# Patient Record
Sex: Female | Born: 1980 | Race: White | Hispanic: No | Marital: Married | Smoking: Never smoker
Health system: Southern US, Community
[De-identification: ages and names within clinical notes are randomized; demographics above are authoritative.]

## PROBLEM LIST (undated history)

## (undated) DIAGNOSIS — J45909 Unspecified asthma, uncomplicated: Secondary | ICD-10-CM

## (undated) DIAGNOSIS — K648 Other hemorrhoids: Secondary | ICD-10-CM

## (undated) DIAGNOSIS — K5190 Ulcerative colitis, unspecified, without complications: Secondary | ICD-10-CM

## (undated) DIAGNOSIS — T7840XA Allergy, unspecified, initial encounter: Secondary | ICD-10-CM

## (undated) DIAGNOSIS — R0789 Other chest pain: Secondary | ICD-10-CM

## (undated) DIAGNOSIS — Z9289 Personal history of other medical treatment: Secondary | ICD-10-CM

## (undated) HISTORY — DX: Personal history of other medical treatment: Z92.89

## (undated) HISTORY — PX: OTHER SURGICAL HISTORY: SHX169

## (undated) HISTORY — DX: Other chest pain: R07.89

## (undated) HISTORY — DX: Other hemorrhoids: K64.8

## (undated) HISTORY — DX: Allergy, unspecified, initial encounter: T78.40XA

## (undated) HISTORY — DX: Unspecified asthma, uncomplicated: J45.909

## (undated) HISTORY — PX: WISDOM TOOTH EXTRACTION: SHX21

## (undated) HISTORY — DX: Ulcerative colitis, unspecified, without complications: K51.90

---

## 2002-02-01 ENCOUNTER — Encounter: Payer: Self-pay | Admitting: Gastroenterology

## 2006-09-21 ENCOUNTER — Ambulatory Visit: Payer: Self-pay | Admitting: Internal Medicine

## 2006-09-21 DIAGNOSIS — J45909 Unspecified asthma, uncomplicated: Secondary | ICD-10-CM | POA: Insufficient documentation

## 2006-09-21 DIAGNOSIS — J309 Allergic rhinitis, unspecified: Secondary | ICD-10-CM | POA: Insufficient documentation

## 2006-09-21 LAB — CONVERTED CEMR LAB
Blood in Urine, dipstick: NEGATIVE
Ketones, urine, test strip: NEGATIVE
Nitrite: NEGATIVE
Protein, U semiquant: NEGATIVE
Specific Gravity, Urine: <1.005
WBC Urine, dipstick: NEGATIVE
pH: 6.5

## 2006-11-04 LAB — CONVERTED CEMR LAB
Albumin: 3.7 g/dL (ref 3.5–5.2)
Alkaline Phosphatase: 35 units/L — ABNORMAL LOW (ref 39–117)
BUN: 12 mg/dL (ref 6–23)
Basophils Absolute: 0.1 10*3/uL (ref 0.0–0.1)
Calcium: 8.8 mg/dL (ref 8.4–10.5)
Cholesterol: 161 mg/dL (ref 0–200)
GFR calc Af Amer: 112 mL/min
GFR calc non Af Amer: 93 mL/min
HDL: 45.3 mg/dL (ref >39.0)
Hemoglobin: 14 g/dL (ref 12.0–15.0)
LDL Cholesterol: 97 mg/dL (ref 0–99)
Lymphocytes Relative: 18.8 % (ref 12.0–46.0)
MCHC: 35.5 g/dL (ref 30.0–36.0)
Monocytes Absolute: 0.5 10*3/uL (ref 0.2–0.7)
Monocytes Relative: 5.6 % (ref 3.0–11.0)
Neutro Abs: 7.1 10*3/uL (ref 1.4–7.7)
Platelets: 257 10*3/uL (ref 150–400)
Potassium: 4 meq/L (ref 3.5–5.1)
Triglycerides: 96 mg/dL (ref 0–149)

## 2007-06-28 ENCOUNTER — Ambulatory Visit: Payer: Self-pay | Admitting: Internal Medicine

## 2008-02-11 ENCOUNTER — Telehealth: Payer: Self-pay | Admitting: Internal Medicine

## 2008-06-02 ENCOUNTER — Ambulatory Visit: Payer: Self-pay | Admitting: Internal Medicine

## 2008-06-02 DIAGNOSIS — K625 Hemorrhage of anus and rectum: Secondary | ICD-10-CM | POA: Insufficient documentation

## 2008-06-02 LAB — CONVERTED CEMR LAB: Hemoglobin: 14.7 g/dL

## 2008-06-07 ENCOUNTER — Ambulatory Visit: Payer: Self-pay | Admitting: Gastroenterology

## 2008-06-08 LAB — CONVERTED CEMR LAB
Basophils Relative: 0.7 % (ref 0.0–3.0)
CO2: 30 meq/L (ref 19–32)
Eosinophils Relative: 3.7 % (ref 0.0–5.0)
GFR calc non Af Amer: 91.49 mL/min (ref >60)
Glucose, Bld: 90 mg/dL (ref 70–99)
HCT: 43.2 % (ref 36.0–46.0)
Hemoglobin: 14.9 g/dL (ref 12.0–15.0)
Lymphs Abs: 2.3 10*3/uL (ref 0.7–4.0)
Monocytes Relative: 7.2 % (ref 3.0–12.0)
Neutro Abs: 2.7 10*3/uL (ref 1.4–7.7)
Platelets: 240 10*3/uL (ref 150.0–400.0)
RBC: 4.58 M/uL (ref 3.87–5.11)
Sed Rate: 5 mm/hr (ref 0–22)
Sodium: 141 meq/L (ref 135–145)
Total Bilirubin: 1 mg/dL (ref 0.3–1.2)
Total Protein: 7.3 g/dL (ref 6.0–8.3)
WBC: 5.6 10*3/uL (ref 4.5–10.5)

## 2008-06-09 ENCOUNTER — Telehealth: Payer: Self-pay | Admitting: *Deleted

## 2008-06-09 ENCOUNTER — Encounter: Payer: Self-pay | Admitting: Internal Medicine

## 2008-06-09 ENCOUNTER — Telehealth: Payer: Self-pay | Admitting: Gastroenterology

## 2008-06-14 ENCOUNTER — Encounter: Payer: Self-pay | Admitting: Internal Medicine

## 2008-06-20 ENCOUNTER — Telehealth (INDEPENDENT_AMBULATORY_CARE_PROVIDER_SITE_OTHER): Payer: Self-pay | Admitting: *Deleted

## 2008-06-28 ENCOUNTER — Encounter: Payer: Self-pay | Admitting: Gastroenterology

## 2008-06-28 ENCOUNTER — Ambulatory Visit: Payer: Self-pay | Admitting: Gastroenterology

## 2008-06-29 ENCOUNTER — Encounter (INDEPENDENT_AMBULATORY_CARE_PROVIDER_SITE_OTHER): Payer: Self-pay | Admitting: *Deleted

## 2008-06-30 ENCOUNTER — Encounter: Payer: Self-pay | Admitting: Gastroenterology

## 2008-07-31 ENCOUNTER — Ambulatory Visit: Payer: Self-pay | Admitting: Gastroenterology

## 2009-04-18 ENCOUNTER — Inpatient Hospital Stay (HOSPITAL_COMMUNITY): Admission: AD | Admit: 2009-04-18 | Discharge: 2009-04-21 | Payer: Self-pay | Admitting: Obstetrics and Gynecology

## 2009-06-06 ENCOUNTER — Telehealth (INDEPENDENT_AMBULATORY_CARE_PROVIDER_SITE_OTHER): Payer: Self-pay | Admitting: *Deleted

## 2009-06-13 ENCOUNTER — Telehealth: Payer: Self-pay | Admitting: *Deleted

## 2009-07-04 ENCOUNTER — Encounter (INDEPENDENT_AMBULATORY_CARE_PROVIDER_SITE_OTHER): Payer: Self-pay | Admitting: *Deleted

## 2009-10-26 ENCOUNTER — Ambulatory Visit: Payer: Self-pay | Admitting: Gastroenterology

## 2010-01-09 ENCOUNTER — Ambulatory Visit: Payer: Self-pay | Admitting: Internal Medicine

## 2010-01-09 DIAGNOSIS — J019 Acute sinusitis, unspecified: Secondary | ICD-10-CM | POA: Insufficient documentation

## 2010-02-24 NOTE — L&D Delivery Note (Signed)
Delivery Note At 11:03 PM a viable female was delivered via Vaginal, Spontaneous Delivery (Presentation: ; Occiput Anterior).  APGAR: 8, 9; weight 7 lb 4.6 oz (3305 g).   Placenta status: Intact, Spontaneous.  Cord: 3 vessels with the following complications: None.  Cord pH: not done  Anesthesia: Epidural  Episiotomy: None Lacerations: 2nd degree;Perineal Suture Repair: 3.0 chromic Est. Blood Loss (mL): 300  Mom to postpartum.  Baby to nursery-stable.  Kassady Laboy L 12/17/2010, 11:18 PM

## 2010-03-26 NOTE — Letter (Signed)
Summary: Office Visit Letter  [facility]  [address] [place], [place] [address]   Phone: [phone]  Fax: [phone]      Jul 04[birth year] 2009 MRN: [id number]   [facility] [place], [place]  [address]   Dear Ms. Riggins,   According to our records, it is time for you to schedule a follow-up office visit with [place] in the month of June 2011.   At your convenience, please call [phone] (option #[phone])to schedule an office visit. If you have any questions, concerns, or feel that this letter is in error, we would appreciate your call.   Sincerely,  Milus Banister, M.D.  [facility] [phone]

## 2010-03-26 NOTE — Assessment & Plan Note (Signed)
Summary: cold symptoms/sinus pressure/cjr   Vital Signs:  Patient profile:   30 year old female Menstrual status:  irregular LMP:     12/07/2009 Weight:      122 pounds Temp:     98.7 degrees F oral Pulse rate:   72 / minute BP sitting:   120 / 80  (left arm) Cuff size:   regular  Vitals Entered By: Sherron Monday, CMA (AAMA) (January 09, 2010 2:35 PM) CC: Sinus pressure, ears hurt, nose hurts and sore throat x 10 days. No fever LMP (date): 12/07/2009 LMP - Character: normal Menarche (age onset years): 13   Menses interval (days): 30-31 Menstrual flow (days): 7 Menstrual Status irregular Enter LMP: 12/07/2009 Last PAP Result Normal   History of Present Illness: Stacy Reeves   comes in today with 10 days of head cold now getting worse with  sinus pressure and achy and  ear ache. Decongestant no help.   and motrin. No teeth  pain but very tight in face.    75 month old at home  with Om and Samuel [place].  No sig coughing  no wheezing  .  nursing her infant.  .     Preventive Screening-Counseling & Management  Alcohol-Tobacco     Alcohol drinks/day: <1     Alcohol type: wine     Smoking Status: never  Caffeine-Diet-Exercise     Does Patient Exercise: yes     Type of exercise: walking or running     Times/week: 4  Current Medications (verified): 1)  Allegra 180 Mg  Tabs (Fexofenadine Hcl) .Marland Kitchen.. 1 By Mouth Once Daily 2)  Multivitamins   Tabs (Multiple Vitamin) 3)  Flonase 50 Mcg/act  Susp (Fluticasone Propionate) .... 2 Sprays Each Nares Once Daily  For Allrergic Rhinitis 4)  Calcarb 600 1500 Mg Tabs (Calcium Carbonate) .... Take 1 Tablet By Mouth Once A Day 5)  Proair Hfa 108 (90 Base) Mcg/act Aers (Albuterol Sulfate) .... Use As Directed 6)  Canasa 1000 Mg  Supp (Mesalamine) .... Take One, Nightly As Needed  Allergies (verified): 1)  ! Sulfa  Past History:  Past medical, surgical, family and social histories (including risk factors) reviewed, and no changes noted  (except as noted below).  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 g1 p1   2011 sigmoidoscopy in 2005, in [place], inflammation seen spring allergic rhinits.   Past Surgical History: Reviewed history from 06/07/2008 and no changes required. mole  removals no cancer   Past History:  Care Management: Gynecology: Dr. Feliciana Rossetti in [place]-in the past; Pt now see's Dr. Julien Girt  Family History: Reviewed history from 06/02/2008 and no changes required. Family History High cholesterol FA Family History Hypertension negative for diabetes ? if polyps  in parents no cancer or IBD  or colitis  Social History: Reviewed history from 06/07/2008 and no changes required. Occupation:an accountant for [employer] Married Never Smoked rarely drinks alcohol, rarely drinks caffeinated beverages 77 month old nursing   child  at homehas cold and Om    Review of Systems  The patient denies anorexia, fever, weight loss, weight gain, vision loss, decreased hearing, hoarseness, chest pain, syncope, dyspnea on exertion, peripheral edema, prolonged cough, abdominal pain, melena, hematochezia, severe indigestion/heartburn, transient blindness, difficulty walking, unusual weight change, enlarged lymph nodes, and angioedema.    Physical Exam  General:  congested in nad    Head:  Normocephalic and atraumatic without obvious  abnormalities. No apparent alopecia or balding. Eyes:  PERRL, EOMs full, conjunctiva clear  Ears:  R ear normal, L ear normal, and no external deformities.   Nose:  no external deformity and no external erythema.  3+ turbinates  very colgges no tenderness to tapping Mouth:  pharynx pink and moist.   Neck:  No deformities, masses, or tenderness noted. Lungs:  Normal respiratory effort, chest expands symmetrically. Lungs are clear to auscultation, no crackles or wheezes. Heart:  Normal rate and regular rhythm. S1 and S2  normal without gallop, murmur, click, rub or other extra sounds. Cervical Nodes:  No lymphadenopathy noted Psych:  Oriented X3 and normally interactive.     Impression & Recommendations:  Problem # 1:  SINUSITIS - ACUTE-NOS (QPR-916.9) prolonged uri worsening  unresonsive to decongestants  Her updated medication list for this problem includes:    Flonase 50 Mcg/act Susp (Fluticasone propionate) .Marland Kitchen... 2 sprays each nares once daily  for allrergic rhinitis    Amoxicillin 875 Mg Tabs (Amoxicillin) .Marland Kitchen... 1 by mouth three times a day  for sinusitis    Fluticasone Propionate 50 Mcg/act Susp (Fluticasone propionate) .Marland Kitchen... 2 sprays each nostril once daily  Problem # 2:  ALLERGIC RHINITIS (ICD-477.9) spring mostly but could contribute   restart flonase Her updated medication list for this problem includes:    Allegra 180 Mg Tabs (Fexofenadine hcl) .Marland Kitchen... 1 by mouth once daily    Flonase 50 Mcg/act Susp (Fluticasone propionate) .Marland Kitchen... 2 sprays each nares once daily  for allrergic rhinitis    Fluticasone Propionate 50 Mcg/act Susp (Fluticasone propionate) .Marland Kitchen... 2 sprays each nostril once daily  Complete Medication List: 1)  Allegra 180 Mg Tabs (Fexofenadine hcl) .Marland Kitchen.. 1 by mouth once daily 2)  Multivitamins Tabs (Multiple vitamin) 3)  Flonase 50 Mcg/act Susp (Fluticasone propionate) .... 2 sprays each nares once daily  for allrergic rhinitis 4)  Calcarb 600 1500 Mg Tabs (Calcium carbonate) .... Take 1 tablet by mouth once a day 5)  Proair Hfa 108 (90 Base) Mcg/act Aers (Albuterol sulfate) .... Use as directed 6)  Canasa 1000 Mg Supp (Mesalamine) .... Take one, nightly as needed 7)  Amoxicillin 875 Mg Tabs (Amoxicillin) .Marland Kitchen.. 1 by mouth three times a day  for sinusitis 8)  Fluticasone Propionate 50 Mcg/act Susp (Fluticasone propionate) .... 2 sprays each nostril once daily  Patient Instructions: 1)  Acute sinusitis symptoms for less than 10 days are not helped by antibiotics. Use warm moist  compresses, and over the counter decongestants( only as directed). Call if no improvement in 5-7 days, sooner if increasing pain, fever, or new symptoms.  2)  add antibiotic  and  steroid nose spray . 3)  can use afrin for 2-3 days if needed to relieve pressure in the meantime.  4)  expedt improvment in 3-5 days .  call if  needed Prescriptions: FLUTICASONE PROPIONATE 50 MCG/ACT SUSP (FLUTICASONE PROPIONATE) 2 sprays each nostril once daily  #1 x prn   Entered and Authorized by:   Burnis Medin MD   Signed by:   Burnis Medin MD on 01/09/2010   Method used:   [facility] to        [facility] [address] [phone]* (retail)       [address]       [place], [place]  [address]       Ph: [phone] or [phone]       Fax: [phone]  RxID:   [id number] AMOXICILLIN 875 MG TABS (AMOXICILLIN) 1 by mouth three times a day  for sinusitis  #30 x 0   Entered and Authorized by:   Burnis Medin MD   Signed by:   Burnis Medin MD on 01/09/2010   Method used:   [facility]        [facility] [address] [phone]* (retail)       [address]       [place], [place]  [address]       Ph: [phone] or [phone]       Fax: [phone]   RxID:   [id number]    Orders Added: 1)  Est. Patient Level IV [12244]

## 2010-03-26 NOTE — Progress Notes (Signed)
Summary: REQ FOR NEW Rx  [facility])  Phone Note Call from Patient   Caller: Patient Reason for Call: Refill Medication Summary of Call: Pt called to see if Dr Regis Bill will approve a new script for her that she was originally prescribed by another physician...Marland Kitchen??  Pt adv that she has had the med:  Proair in the past and would like to have a Rx for same sent to [facility] [place].  Pt can be reached at [phone] with any questions or concerns.  Initial call taken by: Duanne Moron,  June 13, 2009 8:40 AM  Follow-up for Phone Call        i havent seen her in a year   . Needs OV  however  can get more info and may be able to give her an rx in the meantime .  ? dx what are her signs etc etc.  Follow-up by: Burnis Medin MD,  June 13, 2009 1:42 PM  Additional Follow-up for Phone Call Additional follow up Details #1::        LMTOCB Additional Follow-up by: Sherron Monday, [place] Deborra Medina),  June 13, 2009 3:34 PM    Additional Follow-up for Phone Call Additional follow up Details #2::    Spoke to pt- she is having some wheezing during the day worse in am. She takes allegra during but doesn't help with wheezing. Pt aware that rx has been called in and that she needs a rov before next refill. Follow-up by: Sherron Monday, CMA (AAMA),  June 13, 2009 5:09 PM  New/Updated Medications: PROAIR HFA 108 (90 BASE) MCG/ACT AERS (ALBUTEROL SULFATE) use as directed Prescriptions: PROAIR HFA 108 (90 BASE) MCG/ACT AERS (ALBUTEROL SULFATE) use as directed  #1 x 0   Entered by:   Sherron Monday, CMA (AAMA)   Authorized by:   Burnis Medin MD   Signed by:   Sherron Monday, CMA (AAMA) on 06/13/2009   Method used:   [facility]        [facility] [address] [phone]* (retail)       [address]       [place], [place]  [address]       Ph: [phone] or [phone]       Fax: [phone]   RxID:   [id number]

## 2010-03-26 NOTE — Progress Notes (Signed)
Summary: Records request from [place] for records received from [facility]. Request forwarded to [employer]. Dena Chavis  June 06, 2009 4:50 PM

## 2010-03-26 NOTE — Assessment & Plan Note (Signed)
  Review of gastrointestinal problems: 1. Limited proctitis, diagnosed originally 2004 By flexible sigmoidoscopy elsewhere. Responded to steroid suppositories. Colonoscopy May 2010 by Dr. Ardis Hughs showed chronic inflammation to 6-7 cm from anus, biopsies confirmed chronic inflammation.  Terminal ileum was normal.  She responded well to nightly Canasa suppositories.  August, 2011: doing very well after the birth of her first daughter in February.   History of Present Illness Visit Type: Follow-up Visit Primary GI MD: Owens Loffler MD Primary Provider: Shanon Ace, MD Requesting Provider: na Chief Complaint: Yearly f/u  History of Present Illness:     very pleasant 30 year old woman whom I last saw a bit over a year ago. Since then she has had an uneventful pregnancy and vaginal delivery of her first daughter, born this past February.  She has done very well from a proctitis standpoint, she has not needed any suppositories or other treatments for about a year.           Current Medications (verified): 1)  Allegra 180 Mg  Tabs (Fexofenadine Hcl) .Marland Kitchen.. 1 By Mouth Once Daily 2)  Multivitamins   Tabs (Multiple Vitamin) 3)  Flonase 50 Mcg/act  Susp (Fluticasone Propionate) .... 2 Sprays Each Nares Once Daily  For Allrergic Rhinitis 4)  Calcarb 600 1500 Mg Tabs (Calcium Carbonate) .... Take 1 Tablet By Mouth Once A Day 5)  Proair Hfa 108 (90 Base) Mcg/act Aers (Albuterol Sulfate) .... Use As Directed  Allergies (verified): 1)  ! Sulfa  Vital Signs:  Patient profile:   30 year old female Menstrual status:  regular Height:      67 inches Weight:      124 pounds BMI:     19.49 BSA:     1.65 Pulse rate:   60 / minute Pulse rhythm:   regular BP sitting:   120 / 64  (left arm) Cuff size:   regular  Vitals Entered By: Hope Pigeon CMA (October 26, 2009 3:47 PM)  Physical Exam  Additional Exam:  Constitutional: generally well appearing Psychiatric: alert and oriented times 3 Abdomen:  soft, non-tender, non-distended, normal bowel sounds    Impression & Recommendations:  Problem # 1:  proctitis she had very mild, limited proctitis that was very easy to control on Canasa suppositories only. I have given her a prescription for the suppositories that she can fill if she needs. She will call to let me know if that is the case. I think she can followup on an as-needed basis otherwise.  Patient Instructions: 1)  Let Dr. Ardis Hughs know if new troubles arise. 2)  Script for canasa given. 3)  The medication list was reviewed and reconciled.  All changed / newly prescribed medications were explained.  A complete medication list was provided to the patient / caregiver. Prescriptions: CANASA 1000 MG  SUPP (MESALAMINE) take one, nightly as needed  #30 x 4   Entered and Authorized by:   Milus Banister MD   Signed by:   Milus Banister MD on 10/26/2009   Method used:   Print then Give to Patient   RxID:   [id number]

## 2010-05-16 LAB — CBC
Hemoglobin: 10.9 g/dL — ABNORMAL LOW (ref 12.0–15.0)
Hemoglobin: 9.4 g/dL — ABNORMAL LOW (ref 12.0–15.0)
MCHC: 33 g/dL (ref 30.0–36.0)
MCV: 90.6 fL (ref 78.0–100.0)
Platelets: 186 10*3/uL (ref 150–400)
RDW: 13.6 % (ref 11.5–15.5)
RDW: 13.6 % (ref 11.5–15.5)

## 2010-05-17 LAB — ANTIBODY SCREEN: Antibody Screen: NEGATIVE

## 2010-05-17 LAB — ABO/RH: RH Type: NEGATIVE

## 2010-05-17 LAB — HEPATITIS B SURFACE ANTIGEN: Hepatitis B Surface Ag: NEGATIVE

## 2010-05-17 LAB — RPR: RPR: NONREACTIVE

## 2010-11-16 LAB — STREP B DNA PROBE: GBS: NEGATIVE

## 2010-12-17 ENCOUNTER — Inpatient Hospital Stay (HOSPITAL_COMMUNITY): Payer: BC Managed Care – PPO | Admitting: Anesthesiology

## 2010-12-17 ENCOUNTER — Encounter (HOSPITAL_COMMUNITY): Payer: Self-pay | Admitting: *Deleted

## 2010-12-17 ENCOUNTER — Inpatient Hospital Stay (HOSPITAL_COMMUNITY)
Admission: AD | Admit: 2010-12-17 | Discharge: 2010-12-19 | DRG: 373 | Disposition: A | Discharge status: Home / Self Care | Payer: BC Managed Care – PPO | Source: Ambulatory Visit | Attending: Obstetrics and Gynecology | Admitting: Obstetrics and Gynecology

## 2010-12-17 ENCOUNTER — Encounter (HOSPITAL_COMMUNITY): Payer: Self-pay | Admitting: Anesthesiology

## 2010-12-17 DIAGNOSIS — J45909 Unspecified asthma, uncomplicated: Secondary | ICD-10-CM

## 2010-12-17 DIAGNOSIS — J019 Acute sinusitis, unspecified: Secondary | ICD-10-CM

## 2010-12-17 DIAGNOSIS — J309 Allergic rhinitis, unspecified: Secondary | ICD-10-CM

## 2010-12-17 DIAGNOSIS — K625 Hemorrhage of anus and rectum: Secondary | ICD-10-CM

## 2010-12-17 LAB — CBC
MCH: 33.1 pg (ref 26.0–34.0)
Platelets: 182 10*3/uL (ref 150–400)
RBC: 3.81 MIL/uL — ABNORMAL LOW (ref 3.87–5.11)
RDW: 12.6 % (ref 11.5–15.5)
WBC: 7.5 10*3/uL (ref 4.0–10.5)

## 2010-12-17 MED ORDER — LACTATED RINGERS IV SOLN
INTRAVENOUS | Status: DC
  Administered 2010-12-17: 22:00:00 via INTRAVENOUS

## 2010-12-17 MED ORDER — OXYCODONE-ACETAMINOPHEN 5-325 MG PO TABS: 2.0000 | ORAL_TABLET | ORAL | Status: DC | PRN

## 2010-12-17 MED ORDER — ONDANSETRON HCL 4 MG/2ML IJ SOLN: 4.0000 mg | Freq: Four times a day (QID) | INTRAMUSCULAR | Status: DC | PRN

## 2010-12-17 MED ORDER — DIPHENHYDRAMINE HCL 50 MG/ML IJ SOLN: 12.5000 mg | INTRAMUSCULAR | Status: DC | PRN

## 2010-12-17 MED ORDER — OXYTOCIN 20 UNITS IN LACTATED RINGERS INFUSION - SIMPLE
125.0000 mL/h | Freq: Once | INTRAVENOUS | Status: AC
  Administered 2010-12-17: 999 mL/h via INTRAVENOUS
  Filled 2010-12-17: qty 1000

## 2010-12-17 MED ORDER — LIDOCAINE HCL (PF) 1 % IJ SOLN
30.0000 mL | INTRAMUSCULAR | Status: DC | PRN
  Filled 2010-12-17: qty 30

## 2010-12-17 MED ORDER — CITRIC ACID-SODIUM CITRATE 334-500 MG/5ML PO SOLN: 30.0000 mL | ORAL | Status: DC | PRN

## 2010-12-17 MED ORDER — FLEET ENEMA 7-19 GM/118ML RE ENEM: 1.0000 | ENEMA | RECTAL | Status: DC | PRN

## 2010-12-17 MED ORDER — EPHEDRINE 5 MG/ML INJ
10.0000 mg | INTRAVENOUS | Status: DC | PRN
  Filled 2010-12-17: qty 4

## 2010-12-17 MED ORDER — PHENYLEPHRINE 40 MCG/ML (10ML) SYRINGE FOR IV PUSH (FOR BLOOD PRESSURE SUPPORT)
80.0000 ug | PREFILLED_SYRINGE | INTRAVENOUS | Status: DC | PRN
  Filled 2010-12-17: qty 5

## 2010-12-17 MED ORDER — LACTATED RINGERS IV SOLN: 500.0000 mL | Freq: Once | INTRAVENOUS | Status: DC

## 2010-12-17 MED ORDER — PHENYLEPHRINE 40 MCG/ML (10ML) SYRINGE FOR IV PUSH (FOR BLOOD PRESSURE SUPPORT)
80.0000 ug | PREFILLED_SYRINGE | INTRAVENOUS | Status: DC | PRN
  Filled 2010-12-17 (×2): qty 5

## 2010-12-17 MED ORDER — SODIUM BICARBONATE 8.4 % IV SOLN
INTRAVENOUS | Status: DC | PRN
  Administered 2010-12-17: 4 mL via EPIDURAL

## 2010-12-17 MED ORDER — LACTATED RINGERS IV SOLN: 500.0000 mL | INTRAVENOUS | Status: DC | PRN

## 2010-12-17 MED ORDER — FENTANYL 2.5 MCG/ML BUPIVACAINE 1/10 % EPIDURAL INFUSION (WH - ANES)
INTRAMUSCULAR | Status: DC | PRN
  Administered 2010-12-17: 14 mL/h via EPIDURAL

## 2010-12-17 MED ORDER — IBUPROFEN 600 MG PO TABS
600.0000 mg | ORAL_TABLET | Freq: Four times a day (QID) | ORAL | Status: DC | PRN
  Administered 2010-12-17: 600 mg via ORAL
  Filled 2010-12-17: qty 1

## 2010-12-17 MED ORDER — FENTANYL 2.5 MCG/ML BUPIVACAINE 1/10 % EPIDURAL INFUSION (WH - ANES)
14.0000 mL/h | INTRAMUSCULAR | Status: DC
  Filled 2010-12-17: qty 60

## 2010-12-17 MED ORDER — EPHEDRINE 5 MG/ML INJ
10.0000 mg | INTRAVENOUS | Status: DC | PRN
  Filled 2010-12-17 (×2): qty 4

## 2010-12-17 MED ORDER — ACETAMINOPHEN 325 MG PO TABS: 650.0000 mg | ORAL_TABLET | ORAL | Status: DC | PRN

## 2010-12-17 MED ORDER — OXYTOCIN BOLUS FROM INFUSION
500.0000 mL | Freq: Once | INTRAVENOUS | Status: DC
  Filled 2010-12-17: qty 500

## 2010-12-17 NOTE — Anesthesia Preprocedure Evaluation (Signed)
Anesthesia Evaluation  Patient identified by MRN, date of birth, ID band Patient awake  General Assessment Comment  Reviewed: Allergy & Precautions, H&P , Patient's Chart, lab work & pertinent test results  Airway Mallampati: II TM Distance: >3 FB Neck ROM: full    Dental  (+) Teeth Intact   Pulmonary  clear to auscultation        Cardiovascular regular Normal    Neuro/Psych    GI/Hepatic   Endo/Other    Renal/GU      Musculoskeletal   Abdominal   Peds  Hematology   Anesthesia Other Findings       Reproductive/Obstetrics (+) Pregnancy                           Anesthesia Physical Anesthesia Plan  ASA: II  Anesthesia Plan: Epidural   Post-op Pain Management:    Induction:   Airway Management Planned:   Additional Equipment:   Intra-op Plan:   Post-operative Plan:   Informed Consent: I have reviewed the patients History and Physical, chart, labs and discussed the procedure including the risks, benefits and alternatives for the proposed anesthesia with the patient or authorized representative who has indicated his/her understanding and acceptance.   Dental Advisory Given  Plan Discussed with:   Anesthesia Plan Comments: (Labs checked- platelets confirmed with RN in room. Fetal heart tracing, per RN, reported to be stable enough for sitting procedure. Discussed epidural, and patient consents to the procedure:  included risk of possible headache,backache, failed block, allergic reaction, and nerve injury. This patient was asked if she had any questions or concerns before the procedure started. )        Anesthesia Quick Evaluation

## 2010-12-17 NOTE — Progress Notes (Signed)
PT  SAYS SHE STARTED HURT BAD AT 2PM.  LAST FRI 3 CM  IN OFFICE. DENIES  HSV AND MRSA.

## 2010-12-17 NOTE — Anesthesia Procedure Notes (Signed)
Epidural Patient location during procedure: OB Start time: 12/17/2010 9:38 PM  Preanesthetic Checklist Completed: patient identified, site marked, surgical consent, pre-op evaluation, timeout performed, IV checked, risks and benefits discussed and monitors and equipment checked  Epidural Patient position: sitting Prep: site prepped and draped and DuraPrep Patient monitoring: continuous pulse ox and blood pressure Approach: midline Injection technique: LOR air  Needle:  Needle type: Tuohy  Needle gauge: 17 G Needle length: 9 cm Needle insertion depth: 5 cm cm Catheter type: closed end flexible Catheter size: 19 Gauge Catheter at skin depth: 10 cm Test dose: negative  Assessment Events: blood not aspirated, injection not painful, no injection resistance, negative IV test and no paresthesia  Additional Notes Dosing of Epidural:  1st dose, through needle ............................................Marland Kitchen epi 1:200K + Xylocaine 40 mg  2nd dose, through catheter, after waiting 3 minutes...Marland KitchenMarland Kitchenepi 1:200K + Xylocaine 40 mg  3rd dose, through catheter after waiting 3 minutes .............................Marcaine   62m   ( mg Marcaine are expressed as equivilent  cc's medication removed from the 0.1%Bupiv / fentanyl syringe from L&D pump)  ( 2% Xylo charted as a single dose in Epic Meds for ease of charting; actual dosing was fractionated as above, for saftey's sake)  As each dose occurred, patient was free of IV sx; and patient exhibited no evidence of SA injection.  Patient is more comfortable after epidural dosed. Please see RN's note for documentation of vital signs,and FHR which are stable.

## 2010-12-17 NOTE — H&P (Signed)
30 year old G2 P1001 at term presented in active labor Was 5 cm on admission PNC see [facility] History of 1 NSVD  Afebrile  Vital Signs Stable FHR is reactive Toco Uterine Contractions every 2 to 3 minutes Lung CTAB Car RRR Cervix C/C/+3  Imp IUP at term  Plan NSVD

## 2010-12-18 ENCOUNTER — Encounter (HOSPITAL_COMMUNITY): Payer: Self-pay | Admitting: *Deleted

## 2010-12-18 LAB — CBC
Hemoglobin: 11.6 g/dL — ABNORMAL LOW (ref 12.0–15.0)
MCHC: 34.4 g/dL (ref 30.0–36.0)
Platelets: 147 10*3/uL — ABNORMAL LOW (ref 150–400)

## 2010-12-18 LAB — RPR: RPR Ser Ql: NONREACTIVE

## 2010-12-18 MED ORDER — BISACODYL 10 MG RE SUPP: 10.0000 mg | Freq: Every day | RECTAL | Status: DC | PRN

## 2010-12-18 MED ORDER — MEASLES, MUMPS & RUBELLA VAC ~~LOC~~ INJ
0.5000 mL | INJECTION | Freq: Once | SUBCUTANEOUS | Status: DC
  Filled 2010-12-18: qty 0.5

## 2010-12-18 MED ORDER — FLEET ENEMA 7-19 GM/118ML RE ENEM: 1.0000 | ENEMA | RECTAL | Status: DC | PRN

## 2010-12-18 MED ORDER — SENNOSIDES-DOCUSATE SODIUM 8.6-50 MG PO TABS
2.0000 | ORAL_TABLET | Freq: Every day | ORAL | Status: DC
  Administered 2010-12-18: 2 via ORAL

## 2010-12-18 MED ORDER — MEDROXYPROGESTERONE ACETATE 150 MG/ML IM SUSP: 150.0000 mg | INTRAMUSCULAR | Status: DC | PRN

## 2010-12-18 MED ORDER — ZOLPIDEM TARTRATE 5 MG PO TABS: 5.0000 mg | ORAL_TABLET | Freq: Every evening | ORAL | Status: DC | PRN

## 2010-12-18 MED ORDER — IBUPROFEN 600 MG PO TABS
600.0000 mg | ORAL_TABLET | Freq: Four times a day (QID) | ORAL | Status: DC
  Administered 2010-12-18 – 2010-12-19 (×6): 600 mg via ORAL
  Filled 2010-12-18 (×6): qty 1

## 2010-12-18 MED ORDER — BENZOCAINE-MENTHOL 20-0.5 % EX AERO
INHALATION_SPRAY | CUTANEOUS | Status: AC
  Administered 2010-12-18: 02:00:00
  Filled 2010-12-18: qty 56

## 2010-12-18 MED ORDER — DIBUCAINE 1 % RE OINT
1.0000 "application " | TOPICAL_OINTMENT | RECTAL | Status: DC | PRN
  Administered 2010-12-18: 1 via RECTAL
  Filled 2010-12-18: qty 28

## 2010-12-18 MED ORDER — OXYCODONE-ACETAMINOPHEN 5-325 MG PO TABS
1.0000 | ORAL_TABLET | ORAL | Status: DC | PRN
  Administered 2010-12-18: 1 via ORAL
  Filled 2010-12-18: qty 1

## 2010-12-18 MED ORDER — LANOLIN HYDROUS EX OINT: TOPICAL_OINTMENT | CUTANEOUS | Status: DC | PRN

## 2010-12-18 MED ORDER — RHO D IMMUNE GLOBULIN 1500 UNIT/2ML IJ SOLN
300.0000 ug | Freq: Once | INTRAMUSCULAR | Status: AC
  Administered 2010-12-18: 300 ug via INTRAMUSCULAR
  Filled 2010-12-18: qty 2

## 2010-12-18 MED ORDER — ONDANSETRON HCL 4 MG PO TABS: 4.0000 mg | ORAL_TABLET | ORAL | Status: DC | PRN

## 2010-12-18 MED ORDER — SIMETHICONE 80 MG PO CHEW: 80.0000 mg | CHEWABLE_TABLET | ORAL | Status: DC | PRN

## 2010-12-18 MED ORDER — WITCH HAZEL-GLYCERIN EX PADS
1.0000 "application " | MEDICATED_PAD | CUTANEOUS | Status: DC | PRN
  Administered 2010-12-18: 1 via TOPICAL

## 2010-12-18 MED ORDER — TETANUS-DIPHTH-ACELL PERTUSSIS 5-2.5-18.5 LF-MCG/0.5 IM SUSP: 0.5000 mL | Freq: Once | INTRAMUSCULAR | Status: DC

## 2010-12-18 MED ORDER — PRENATAL PLUS 27-1 MG PO TABS
1.0000 | ORAL_TABLET | Freq: Every day | ORAL | Status: DC
  Administered 2010-12-18 – 2010-12-19 (×2): 1 via ORAL
  Filled 2010-12-18 (×2): qty 1

## 2010-12-18 MED ORDER — BENZOCAINE-MENTHOL 20-0.5 % EX AERO: 1.0000 "application " | INHALATION_SPRAY | CUTANEOUS | Status: DC | PRN

## 2010-12-18 MED ORDER — ONDANSETRON HCL 4 MG/2ML IJ SOLN: 4.0000 mg | INTRAMUSCULAR | Status: DC | PRN

## 2010-12-18 MED ORDER — DIPHENHYDRAMINE HCL 25 MG PO CAPS
25.0000 mg | ORAL_CAPSULE | Freq: Four times a day (QID) | ORAL | Status: DC | PRN
  Administered 2010-12-18: 25 mg via ORAL
  Filled 2010-12-18: qty 1

## 2010-12-18 NOTE — Progress Notes (Signed)
Post Partum Day 1 Subjective: no complaints, up ad lib and voiding  Objective: Blood pressure 112/68, pulse 78, temperature 98.5 F (36.9 C), temperature source Oral, resp. rate 18, height 5' 6"  (1.676 m), weight 73.539 kg (162 lb 2 oz), SpO2 100.00%, unknown if currently breastfeeding.  Physical Exam:  General: alert and cooperative Lochia: appropriate Uterine Fundus: firm Perineum intact, small labial edema DVT Evaluation: No evidence of DVT seen on physical exam.   Basename 12/18/10 0505 12/17/10 2030  HGB 11.6* 12.6  HCT 33.7* 36.2    Assessment/Plan: Plan for discharge tomorrow   LOS: 1 day   Amol Domanski G 12/18/2010, 8:08 AM

## 2010-12-19 LAB — RH IG WORKUP (INCLUDES ABO/RH)
ABO/RH(D): A NEG
Gestational Age(Wks): 40
Unit division: 0

## 2010-12-19 MED ORDER — IBUPROFEN 600 MG PO TABS: 600.0000 mg | ORAL_TABLET | Freq: Four times a day (QID) | ORAL | Status: AC

## 2010-12-19 NOTE — Addendum Note (Signed)
Addendum  created 12/19/10 1935 by Riccardo Dubin   Modules edited:Notes Section

## 2010-12-19 NOTE — Discharge Summary (Signed)
Obstetric Discharge Summary Reason for Admission: onset of labor Prenatal Procedures: none Intrapartum Procedures: spontaneous vaginal delivery Postpartum Procedures: none Complications-Operative and Postpartum: 2 degree perineal laceration Hemoglobin  Date Value Range Status  12/18/2010 11.6* 12.0-15.0 (g/dL) Final     HCT  Date Value Range Status  12/18/2010 33.7* 36.0-46.0 (%) Final    Discharge Diagnoses: Term Pregnancy-delivered  Discharge Information: Date: 12/19/2010 Activity: pelvic rest Diet: routine Medications: PNV and Ibuprofen Condition: stable Instructions: refer to practice specific booklet Discharge to: home   Newborn Data: Live born female  Birth Weight: 7 lb 4.6 oz (3305 g) APGAR: 8, 9  Home with mother.  Stacy Reeves G 12/19/2010, 8:17 AM

## 2010-12-19 NOTE — Anesthesia Postprocedure Evaluation (Signed)
  Anesthesia Post-op Note  Patient: Stacy Reeves  This patient has recovered from her labor epidural, and I am not aware of any complications or problems.

## 2010-12-19 NOTE — Progress Notes (Signed)
Post Partum Day 2 Subjective: no complaints, up ad lib, voiding and + flatus  Objective: Blood pressure 109/72, pulse 61, temperature 98.5 F (36.9 C), temperature source Oral, resp. rate 18, height 5' 6"  (1.676 m), weight 73.539 kg (162 lb 2 oz), SpO2 100.00%, unknown if currently breastfeeding.  Physical Exam:  General: alert and cooperative Lochia: appropriate Uterine Fundus: firm Incision: healing well, small hemorrhoid DVT Evaluation: No evidence of DVT seen on physical exam.   Basename 12/18/10 0505 12/17/10 2030  HGB 11.6* 12.6  HCT 33.7* 36.2    Assessment/Plan: Discharge home   LOS: 2 days   Sequita Wise G 12/19/2010, 8:12 AM

## 2011-04-21 ENCOUNTER — Encounter: Payer: Self-pay | Admitting: Internal Medicine

## 2011-04-21 ENCOUNTER — Ambulatory Visit (INDEPENDENT_AMBULATORY_CARE_PROVIDER_SITE_OTHER): Payer: BC Managed Care – PPO | Admitting: Internal Medicine

## 2011-04-21 VITALS — BP 118/70 | HR 100 | Temp 98.2°F | Wt 125.0 lb

## 2011-04-21 DIAGNOSIS — J988 Other specified respiratory disorders: Secondary | ICD-10-CM

## 2011-04-21 DIAGNOSIS — J22 Unspecified acute lower respiratory infection: Secondary | ICD-10-CM | POA: Insufficient documentation

## 2011-04-21 DIAGNOSIS — R0602 Shortness of breath: Secondary | ICD-10-CM

## 2011-04-21 NOTE — Patient Instructions (Addendum)
This acts like a viral resp infection Lungs are normal  exam and pulse ox is normal Call if getting high fever chills or inc sob  If wheezing can use your rescue inhaler.

## 2011-04-21 NOTE — Progress Notes (Signed)
  Subjective:    Patient ID: Stacy Reeves, female    DOB: 08/17/1980, 31 y.o.   MRN: [id number]  HPI Patient comes in today for SDA  For acute problem evaluation.  since last visit in 2011 she has had another child in the fall of 2012  Has 2 children at home . Has had uris at home however this weekend had .  Onset sat with fever   And achy  (  2 days ago. ) now fever may be gone but has ?  Some  Sob?  Hx of wheezing     In spring.  Not now  Nasal congestion  Nursing .  No hemoptysis  Temp yesterday.  Wants to make sure lungs are clear.  Review of Systems No fever now NVD as per hpi   Past history family history social history reviewed in the electronic medical record. Had flu shot this year    Objective:   Physical Exam WDWN in NAD  quiet respirations; nasal congested  somewhat hoarse. Non toxic . HEENT: Normocephalic ;atraumatic , Eyes;  PERRL, EOMs  Full, lids and conjunctiva clear,,Ears: no deformities, canals nl, TM landmarks normal, Nose: no deformity or discharge but congested;face non tender Mouth : OP clear without lesion or edema . Neck: Supple without adenopathy or masses or bruits Chest:  Clear to A&P without wheezes rales or rhonchi CV:  S1-S2 no gallops or murmurs peripheral perfusion is normal Skin :nl perfusion and no acute rashes       Assessment & Plan:  Acute rti prob viral uncomplicated    Sob feeling could be from UR airway  Nl exam and nl pulse ox .however if having sig fever increasing sob or wheeze  call for advice or reeval.

## 2011-09-12 ENCOUNTER — Ambulatory Visit (INDEPENDENT_AMBULATORY_CARE_PROVIDER_SITE_OTHER): Payer: BC Managed Care – PPO | Admitting: Internal Medicine

## 2011-09-12 ENCOUNTER — Encounter: Payer: Self-pay | Admitting: Internal Medicine

## 2011-09-12 VITALS — BP 108/70 | HR 67 | Temp 98.4°F | Wt 120.0 lb

## 2011-09-12 DIAGNOSIS — R509 Fever, unspecified: Secondary | ICD-10-CM

## 2011-09-12 DIAGNOSIS — B341 Enterovirus infection, unspecified: Secondary | ICD-10-CM | POA: Insufficient documentation

## 2011-09-12 DIAGNOSIS — J029 Acute pharyngitis, unspecified: Secondary | ICD-10-CM

## 2011-09-12 MED ORDER — PREDNISONE 20 MG PO TABS: ORAL_TABLET | ORAL | Status: AC

## 2011-09-12 NOTE — Progress Notes (Signed)
  Subjective:    Patient ID: Stacy Reeves, female    DOB: 01/31/1981, 31 y.o.   MRN: [id number]  HPI Patient comes in today for SDA for  new problem evaluation. Onset a few days ago of bad sore throat and fever 103 at night ;noted small blisters in throat . Last pm fever 100.7 range .  No rash and no cough uri.  9 mos and 2.30 yo have hand foot and mouth. Not in day care . No other known exposures . No hx of strep ( neg tests on children)  Going out of town.  Still nursing but weaning.  Taking gargles and obu bet still hurts to swallow. Going to beach next week.  Has ? About infectivity Past history family history social history reviewed in the electronic medical record. Review of Systems  Neg ha syncope cough Gi Gu sx  .  Rash joint changes.  Outpatient Encounter Prescriptions as of 09/12/2011  Medication Sig Dispense Refill  . albuterol (PROAIR HFA) 108 (90 BASE) MCG/ACT inhaler Inhale 2 puffs into the lungs every 6 (six) hours as needed.      . fexofenadine (ALLEGRA) 180 MG tablet Take 180 mg by mouth daily.      . fluticasone (FLONASE) 50 MCG/ACT nasal spray Place 2 sprays into the nose daily.      . prenatal vitamin w/FE, FA (PRENATAL 1 + 1) 27-1 MG TABS Take 1 tablet by mouth daily.        . Calcium Carbonate (CALCARB 600) 1500 MG TABS Take by mouth daily.      . Multiple Vitamins-Minerals (MULTIVITAMIN PO) Take by mouth daily.           Objective:   Physical Exam BP 108/70  Pulse 67  Temp 98.4 F (36.9 C) (Oral)  Wt 120 lb (54.432 kg)  SpO2 98%  LMP 09/11/2011  Breastfeeding? Yes WDWN in nad non toxic HEENT: Normocephalic ;atraumatic , Eyes;  PERRL, EOMs  Full, lids and conjunctiva clear,,Ears: no deformities, canals nl, TM landmarks normal, Nose: no deformity or discharge  Mouth : OP  Very red no petechia or edema  Small area of ? Pin point whitish early ulcer?  Good airway . Neck: Supple without adenopathy or masses or bruits some tenderness Chest:  Clear to A&P without  wheezes rales or rhonchi CV:  S1-S2 no gallops or murmurs peripheral perfusion is normal Abdomen:  Sof,t normal bowel sounds without hepatosplenomegaly, no guarding rebound or masses no CVA tenderness No clubbing cyanosis or edema Skin no rash  Nl turgor.  . rs neg    Assessment & Plan:  Fever and pharyngitis Exposures to HFM  Neg strep  cx pending   Most likely coxsackie  Sx rx for now  Will contact about thr cx .  Pt asks about use of prednisone for throat pain as  Used when she had mono.  rx given with precaution of use in acitve infection. Continue other interventions  For comfort in the meantime  Disc other ? s

## 2011-09-12 NOTE — Patient Instructions (Signed)
This is most likely viral coxsackie such as hand-foot-and-mouth.  And use prednisone to decrease pain and swelling although it does not fight infection and decrease  Immunity.  We'll contact you when culture results are back.

## 2011-09-14 LAB — CULTURE, GROUP A STREP: Organism ID, Bacteria: NORMAL

## 2011-09-15 ENCOUNTER — Encounter: Payer: Self-pay | Admitting: Family Medicine

## 2012-11-17 ENCOUNTER — Other Ambulatory Visit: Payer: BC Managed Care – PPO

## 2012-11-19 ENCOUNTER — Ambulatory Visit (INDEPENDENT_AMBULATORY_CARE_PROVIDER_SITE_OTHER): Payer: BC Managed Care – PPO | Admitting: *Deleted

## 2012-11-19 VITALS — BP 124/75

## 2012-11-19 DIAGNOSIS — O30009 Twin pregnancy, unspecified number of placenta and unspecified number of amniotic sacs, unspecified trimester: Secondary | ICD-10-CM

## 2012-11-19 NOTE — Progress Notes (Signed)
P = 93    Pt states she has had irregular UC's since last night- some are painful.  Unable to adequately monitor FHR of Twin B due to increased fetal activity and fetal position.   Pt has [place] and prenatal visit immediately following NST.  Copy of report and NST tracing sent to Dr. Julien Girt w/pt today.

## 2012-11-24 ENCOUNTER — Ambulatory Visit (INDEPENDENT_AMBULATORY_CARE_PROVIDER_SITE_OTHER): Payer: BC Managed Care – PPO | Admitting: *Deleted

## 2012-11-24 VITALS — BP 128/69

## 2012-11-24 DIAGNOSIS — O30009 Twin pregnancy, unspecified number of placenta and unspecified number of amniotic sacs, unspecified trimester: Secondary | ICD-10-CM

## 2012-11-24 NOTE — Progress Notes (Signed)
P = 94   Copy of report and NST tracing sent to Dr. Corinna Capra w/pt today

## 2012-12-01 ENCOUNTER — Ambulatory Visit (INDEPENDENT_AMBULATORY_CARE_PROVIDER_SITE_OTHER): Payer: BC Managed Care – PPO | Admitting: *Deleted

## 2012-12-01 VITALS — BP 114/71

## 2012-12-01 DIAGNOSIS — O30009 Twin pregnancy, unspecified number of placenta and unspecified number of amniotic sacs, unspecified trimester: Secondary | ICD-10-CM

## 2012-12-01 NOTE — Progress Notes (Signed)
P = 89      Copy of report and NST tracing sent to Dr. Matthew Saras w/pt today

## 2012-12-08 ENCOUNTER — Ambulatory Visit (INDEPENDENT_AMBULATORY_CARE_PROVIDER_SITE_OTHER): Payer: BC Managed Care – PPO | Admitting: *Deleted

## 2012-12-08 VITALS — BP 121/73

## 2012-12-08 DIAGNOSIS — O30009 Twin pregnancy, unspecified number of placenta and unspecified number of amniotic sacs, unspecified trimester: Secondary | ICD-10-CM

## 2012-12-08 NOTE — Progress Notes (Signed)
P=88

## 2012-12-15 ENCOUNTER — Ambulatory Visit (INDEPENDENT_AMBULATORY_CARE_PROVIDER_SITE_OTHER): Payer: BC Managed Care – PPO | Admitting: *Deleted

## 2012-12-15 VITALS — BP 124/73

## 2012-12-15 DIAGNOSIS — O30009 Twin pregnancy, unspecified number of placenta and unspecified number of amniotic sacs, unspecified trimester: Secondary | ICD-10-CM

## 2012-12-15 NOTE — Progress Notes (Signed)
P=88 Contractions noted on NST pt denies feeling contractions

## 2012-12-22 ENCOUNTER — Ambulatory Visit (INDEPENDENT_AMBULATORY_CARE_PROVIDER_SITE_OTHER): Payer: BC Managed Care – PPO | Admitting: *Deleted

## 2012-12-22 VITALS — BP 123/72

## 2012-12-22 DIAGNOSIS — O30009 Twin pregnancy, unspecified number of placenta and unspecified number of amniotic sacs, unspecified trimester: Secondary | ICD-10-CM

## 2012-12-22 NOTE — Progress Notes (Signed)
P = 86   Copy of report and NST tracing sent to Dr. Radene Knee w/pt

## 2012-12-23 ENCOUNTER — Inpatient Hospital Stay (HOSPITAL_COMMUNITY)
Admission: AD | Admit: 2012-12-23 | Discharge: 2012-12-26 | DRG: 766 | Disposition: A | Discharge status: Home / Self Care | Payer: BC Managed Care – PPO | Source: Ambulatory Visit | Attending: Obstetrics and Gynecology | Admitting: Obstetrics and Gynecology

## 2012-12-23 ENCOUNTER — Inpatient Hospital Stay (HOSPITAL_COMMUNITY): Payer: BC Managed Care – PPO

## 2012-12-23 ENCOUNTER — Encounter (HOSPITAL_COMMUNITY): Payer: Self-pay | Admitting: *Deleted

## 2012-12-23 DIAGNOSIS — Z98891 History of uterine scar from previous surgery: Secondary | ICD-10-CM

## 2012-12-23 DIAGNOSIS — O30003 Twin pregnancy, unspecified number of placenta and unspecified number of amniotic sacs, third trimester: Secondary | ICD-10-CM

## 2012-12-23 DIAGNOSIS — O3090 Multiple gestation, unspecified, unspecified trimester: Principal | ICD-10-CM | POA: Diagnosis present

## 2012-12-23 DIAGNOSIS — O30009 Twin pregnancy, unspecified number of placenta and unspecified number of amniotic sacs, unspecified trimester: Secondary | ICD-10-CM | POA: Diagnosis present

## 2012-12-23 MED ORDER — ONDANSETRON 8 MG PO TBDP
8.0000 mg | ORAL_TABLET | Freq: Once | ORAL | Status: AC
  Administered 2012-12-23: 8 mg via ORAL
  Filled 2012-12-23: qty 1

## 2012-12-23 NOTE — MAU Note (Signed)
CHRISTY CALLED FROM L/D - SAID  DR ADKINS VIEWED STRIP-  SAID WATCH X1 HR AND RECHECK  CERVIX

## 2012-12-23 NOTE — MAU Note (Signed)
PT SAYS SHE STARTED HURTING BAD AT 815PM.  ON Tuesday - 2 CM IN OFFICE.   DENIES HSV AND MRSA.

## 2012-12-23 NOTE — Progress Notes (Signed)
Pt reports increase in contraction frequency and intensity a/w nausea.  FHT reassuring x 2 Toco Q3-8 Cvx 2cm - unchanged  A/P:  Twins, suspect early labor zofran given Continue to monitor, recheck cvx 1-2hr Bedside US to check position of baby B

## 2012-12-23 NOTE — MAU Note (Signed)
Pt reports contractions x 2 hours, denies bleeding or ROM

## 2012-12-24 ENCOUNTER — Encounter (HOSPITAL_COMMUNITY): Payer: Self-pay | Admitting: Anesthesiology

## 2012-12-24 ENCOUNTER — Inpatient Hospital Stay (HOSPITAL_COMMUNITY): Payer: BC Managed Care – PPO | Admitting: Anesthesiology

## 2012-12-24 ENCOUNTER — Encounter (HOSPITAL_COMMUNITY): Payer: BC Managed Care – PPO | Admitting: Anesthesiology

## 2012-12-24 ENCOUNTER — Encounter (HOSPITAL_COMMUNITY)
Admission: AD | Disposition: A | Discharge status: Home / Self Care | Payer: Self-pay | Source: Ambulatory Visit | Attending: Obstetrics and Gynecology

## 2012-12-24 LAB — CBC
HCT: 38 % (ref 36.0–46.0)
Hemoglobin: 13.4 g/dL (ref 12.0–15.0)
RDW: 12.8 % (ref 11.5–15.5)
WBC: 10 10*3/uL (ref 4.0–10.5)

## 2012-12-24 LAB — SAMPLE TO BLOOD BANK

## 2012-12-24 LAB — RPR: RPR Ser Ql: NONREACTIVE

## 2012-12-24 SURGERY — Surgical Case
Anesthesia: Spinal | Site: Abdomen | Wound class: Clean Contaminated

## 2012-12-24 MED ORDER — ONDANSETRON HCL 4 MG/2ML IJ SOLN
INTRAMUSCULAR | Status: AC
  Filled 2012-12-24: qty 2

## 2012-12-24 MED ORDER — KETOROLAC TROMETHAMINE 60 MG/2ML IM SOLN
60.0000 mg | Freq: Once | INTRAMUSCULAR | Status: AC | PRN
  Administered 2012-12-24: 60 mg via INTRAMUSCULAR

## 2012-12-24 MED ORDER — SODIUM CHLORIDE 0.9 % IJ SOLN: 3.0000 mL | INTRAMUSCULAR | Status: DC | PRN

## 2012-12-24 MED ORDER — OXYTOCIN 10 UNIT/ML IJ SOLN
INTRAMUSCULAR | Status: DC | PRN
  Administered 2012-12-24: 40 [IU] via INTRAMUSCULAR

## 2012-12-24 MED ORDER — TETANUS-DIPHTH-ACELL PERTUSSIS 5-2.5-18.5 LF-MCG/0.5 IM SUSP: 0.5000 mL | Freq: Once | INTRAMUSCULAR | Status: DC

## 2012-12-24 MED ORDER — ONDANSETRON HCL 4 MG PO TABS: 4.0000 mg | ORAL_TABLET | ORAL | Status: DC | PRN

## 2012-12-24 MED ORDER — ONDANSETRON HCL 4 MG/2ML IJ SOLN: 4.0000 mg | INTRAMUSCULAR | Status: DC | PRN

## 2012-12-24 MED ORDER — HYDROMORPHONE HCL PF 1 MG/ML IJ SOLN
INTRAMUSCULAR | Status: AC
  Filled 2012-12-24: qty 1

## 2012-12-24 MED ORDER — FENTANYL CITRATE 0.05 MG/ML IJ SOLN
INTRAMUSCULAR | Status: DC | PRN
  Administered 2012-12-24: 25 ug via INTRATHECAL

## 2012-12-24 MED ORDER — LACTATED RINGERS IV SOLN
INTRAVENOUS | Status: DC
  Administered 2012-12-24 (×3): via INTRAVENOUS

## 2012-12-24 MED ORDER — SENNOSIDES-DOCUSATE SODIUM 8.6-50 MG PO TABS
2.0000 | ORAL_TABLET | ORAL | Status: DC
  Administered 2012-12-25 (×2): 2 via ORAL
  Filled 2012-12-24 (×2): qty 2

## 2012-12-24 MED ORDER — MENTHOL 3 MG MT LOZG: 1.0000 | LOZENGE | OROMUCOSAL | Status: DC | PRN

## 2012-12-24 MED ORDER — NALBUPHINE HCL 10 MG/ML IJ SOLN
5.0000 mg | INTRAMUSCULAR | Status: DC | PRN
  Filled 2012-12-24: qty 1

## 2012-12-24 MED ORDER — OXYTOCIN 40 UNITS IN LACTATED RINGERS INFUSION - SIMPLE MED: 62.5000 mL/h | INTRAVENOUS | Status: AC

## 2012-12-24 MED ORDER — LANOLIN HYDROUS EX OINT: 1.0000 "application " | TOPICAL_OINTMENT | CUTANEOUS | Status: DC | PRN

## 2012-12-24 MED ORDER — PHENYLEPHRINE 8 MG IN D5W 100 ML (0.08MG/ML) PREMIX OPTIME
INJECTION | INTRAVENOUS | Status: DC | PRN
  Administered 2012-12-24: 60 ug/min via INTRAVENOUS

## 2012-12-24 MED ORDER — KETOROLAC TROMETHAMINE 60 MG/2ML IM SOLN
INTRAMUSCULAR | Status: AC
  Administered 2012-12-24: 60 mg via INTRAMUSCULAR
  Filled 2012-12-24: qty 2

## 2012-12-24 MED ORDER — CEFAZOLIN SODIUM-DEXTROSE 2-3 GM-% IV SOLR
INTRAVENOUS | Status: AC
  Filled 2012-12-24: qty 50

## 2012-12-24 MED ORDER — CEFAZOLIN SODIUM-DEXTROSE 2-3 GM-% IV SOLR
2.0000 g | INTRAVENOUS | Status: AC
  Administered 2012-12-24: 2 g via INTRAVENOUS

## 2012-12-24 MED ORDER — CITRIC ACID-SODIUM CITRATE 334-500 MG/5ML PO SOLN
30.0000 mL | Freq: Once | ORAL | Status: AC
  Administered 2012-12-24: 30 mL via ORAL
  Filled 2012-12-24: qty 15

## 2012-12-24 MED ORDER — HYDROMORPHONE HCL PF 1 MG/ML IJ SOLN
0.2500 mg | INTRAMUSCULAR | Status: DC | PRN
  Administered 2012-12-24 (×4): 0.5 mg via INTRAVENOUS

## 2012-12-24 MED ORDER — DIPHENHYDRAMINE HCL 50 MG/ML IJ SOLN: 12.5000 mg | INTRAMUSCULAR | Status: DC | PRN

## 2012-12-24 MED ORDER — NALOXONE HCL 1 MG/ML IJ SOLN
1.0000 ug/kg/h | INTRAVENOUS | Status: DC | PRN
  Filled 2012-12-24: qty 2

## 2012-12-24 MED ORDER — KETOROLAC TROMETHAMINE 30 MG/ML IJ SOLN: 30.0000 mg | Freq: Four times a day (QID) | INTRAMUSCULAR | Status: AC | PRN

## 2012-12-24 MED ORDER — MORPHINE SULFATE 0.5 MG/ML IJ SOLN
INTRAMUSCULAR | Status: AC
  Filled 2012-12-24: qty 10

## 2012-12-24 MED ORDER — PRENATAL MULTIVITAMIN CH
1.0000 | ORAL_TABLET | Freq: Every day | ORAL | Status: DC
  Administered 2012-12-24 – 2012-12-26 (×3): 1 via ORAL
  Filled 2012-12-24 (×3): qty 1

## 2012-12-24 MED ORDER — MEDROXYPROGESTERONE ACETATE 150 MG/ML IM SUSP: 150.0000 mg | INTRAMUSCULAR | Status: DC | PRN

## 2012-12-24 MED ORDER — EPHEDRINE 5 MG/ML INJ
INTRAVENOUS | Status: AC
  Filled 2012-12-24: qty 10

## 2012-12-24 MED ORDER — SIMETHICONE 80 MG PO CHEW
80.0000 mg | CHEWABLE_TABLET | Freq: Three times a day (TID) | ORAL | Status: DC
  Administered 2012-12-24 – 2012-12-26 (×7): 80 mg via ORAL
  Filled 2012-12-24 (×8): qty 1

## 2012-12-24 MED ORDER — FENTANYL CITRATE 0.05 MG/ML IJ SOLN
INTRAMUSCULAR | Status: AC
  Filled 2012-12-24: qty 2

## 2012-12-24 MED ORDER — SIMETHICONE 80 MG PO CHEW
80.0000 mg | CHEWABLE_TABLET | ORAL | Status: DC
  Administered 2012-12-25 (×2): 80 mg via ORAL
  Filled 2012-12-24 (×2): qty 1

## 2012-12-24 MED ORDER — KETOROLAC TROMETHAMINE 30 MG/ML IJ SOLN
30.0000 mg | Freq: Four times a day (QID) | INTRAMUSCULAR | Status: AC | PRN
  Administered 2012-12-24: 30 mg via INTRAVENOUS
  Filled 2012-12-24 (×2): qty 1

## 2012-12-24 MED ORDER — PHENYLEPHRINE HCL 10 MG/ML IJ SOLN
INTRAMUSCULAR | Status: DC | PRN
  Administered 2012-12-24: 80 ug via INTRAVENOUS
  Administered 2012-12-24: 40 ug via INTRAVENOUS

## 2012-12-24 MED ORDER — LACTATED RINGERS IV SOLN
INTRAVENOUS | Status: DC | PRN
  Administered 2012-12-24 (×2): via INTRAVENOUS

## 2012-12-24 MED ORDER — DIPHENHYDRAMINE HCL 25 MG PO CAPS: 25.0000 mg | ORAL_CAPSULE | Freq: Four times a day (QID) | ORAL | Status: DC | PRN

## 2012-12-24 MED ORDER — DIBUCAINE 1 % RE OINT
1.0000 "application " | TOPICAL_OINTMENT | RECTAL | Status: DC | PRN
  Administered 2012-12-25: 1 via RECTAL
  Filled 2012-12-24: qty 28

## 2012-12-24 MED ORDER — FAMOTIDINE IN NACL 20-0.9 MG/50ML-% IV SOLN: 20.0000 mg | Freq: Once | INTRAVENOUS | Status: DC

## 2012-12-24 MED ORDER — IBUPROFEN 600 MG PO TABS
600.0000 mg | ORAL_TABLET | Freq: Four times a day (QID) | ORAL | Status: DC
  Administered 2012-12-24 – 2012-12-26 (×8): 600 mg via ORAL
  Filled 2012-12-24 (×8): qty 1

## 2012-12-24 MED ORDER — SIMETHICONE 80 MG PO CHEW: 80.0000 mg | CHEWABLE_TABLET | ORAL | Status: DC | PRN

## 2012-12-24 MED ORDER — ONDANSETRON HCL 4 MG/2ML IJ SOLN: 4.0000 mg | Freq: Three times a day (TID) | INTRAMUSCULAR | Status: DC | PRN

## 2012-12-24 MED ORDER — 0.9 % SODIUM CHLORIDE (POUR BTL) OPTIME
TOPICAL | Status: DC | PRN
  Administered 2012-12-24: 1000 mL

## 2012-12-24 MED ORDER — OXYTOCIN 10 UNIT/ML IJ SOLN
INTRAMUSCULAR | Status: AC
  Filled 2012-12-24: qty 4

## 2012-12-24 MED ORDER — PHENYLEPHRINE HCL 10 MG/ML IJ SOLN
INTRAMUSCULAR | Status: AC
  Filled 2012-12-24: qty 1

## 2012-12-24 MED ORDER — SCOPOLAMINE 1 MG/3DAYS TD PT72
1.0000 | MEDICATED_PATCH | Freq: Once | TRANSDERMAL | Status: DC
  Administered 2012-12-24: 1.5 mg via TRANSDERMAL

## 2012-12-24 MED ORDER — DEXTROSE 5 % IV SOLN
INTRAVENOUS | Status: AC
  Filled 2012-12-24: qty 3000

## 2012-12-24 MED ORDER — SCOPOLAMINE 1 MG/3DAYS TD PT72
MEDICATED_PATCH | TRANSDERMAL | Status: AC
  Administered 2012-12-24: 1.5 mg via TRANSDERMAL
  Filled 2012-12-24: qty 1

## 2012-12-24 MED ORDER — ONDANSETRON HCL 4 MG/2ML IJ SOLN
INTRAMUSCULAR | Status: DC | PRN
  Administered 2012-12-24: 4 mg via INTRAVENOUS

## 2012-12-24 MED ORDER — MEPERIDINE HCL 25 MG/ML IJ SOLN: 6.2500 mg | INTRAMUSCULAR | Status: DC | PRN

## 2012-12-24 MED ORDER — DIPHENHYDRAMINE HCL 25 MG PO CAPS: 25.0000 mg | ORAL_CAPSULE | ORAL | Status: DC | PRN

## 2012-12-24 MED ORDER — HYDROMORPHONE HCL PF 1 MG/ML IJ SOLN
INTRAMUSCULAR | Status: AC
  Administered 2012-12-24: 0.5 mg via INTRAVENOUS
  Filled 2012-12-24: qty 1

## 2012-12-24 MED ORDER — DEXTROSE IN LACTATED RINGERS 5 % IV SOLN: INTRAVENOUS | Status: DC

## 2012-12-24 MED ORDER — METOCLOPRAMIDE HCL 5 MG/ML IJ SOLN: 10.0000 mg | Freq: Three times a day (TID) | INTRAMUSCULAR | Status: DC | PRN

## 2012-12-24 MED ORDER — DIPHENHYDRAMINE HCL 50 MG/ML IJ SOLN: 25.0000 mg | INTRAMUSCULAR | Status: DC | PRN

## 2012-12-24 MED ORDER — MORPHINE SULFATE (PF) 0.5 MG/ML IJ SOLN
INTRAMUSCULAR | Status: DC | PRN
  Administered 2012-12-24: .15 mg via INTRATHECAL

## 2012-12-24 MED ORDER — OXYCODONE-ACETAMINOPHEN 5-325 MG PO TABS
1.0000 | ORAL_TABLET | ORAL | Status: DC | PRN
  Administered 2012-12-24: 1 via ORAL
  Administered 2012-12-24: 2 via ORAL
  Administered 2012-12-26: 1 via ORAL
  Filled 2012-12-24: qty 2
  Filled 2012-12-24 (×2): qty 1

## 2012-12-24 MED ORDER — WITCH HAZEL-GLYCERIN EX PADS
1.0000 "application " | MEDICATED_PAD | CUTANEOUS | Status: DC | PRN
  Administered 2012-12-25: 1 via TOPICAL

## 2012-12-24 MED ORDER — NALOXONE HCL 0.4 MG/ML IJ SOLN: 0.4000 mg | INTRAMUSCULAR | Status: DC | PRN

## 2012-12-24 MED ORDER — MEASLES, MUMPS & RUBELLA VAC ~~LOC~~ INJ: 0.5000 mL | INJECTION | Freq: Once | SUBCUTANEOUS | Status: DC

## 2012-12-24 SURGICAL SUPPLY — 33 items
ADH SKN CLS APL DERMABOND .7 (GAUZE/BANDAGES/DRESSINGS) ×1
CLAMP CORD UMBIL (MISCELLANEOUS) ×1 IMPLANT
CLOTH BEACON ORANGE TIMEOUT ST (SAFETY) ×2 IMPLANT
DERMABOND ADVANCED (GAUZE/BANDAGES/DRESSINGS) ×1
DERMABOND ADVANCED .7 DNX12 (GAUZE/BANDAGES/DRESSINGS) ×1 IMPLANT
DRAPE LG THREE QUARTER DISP (DRAPES) ×4 IMPLANT
DRSG OPSITE POSTOP 4X10 (GAUZE/BANDAGES/DRESSINGS) ×1 IMPLANT
DURAPREP 26ML APPLICATOR (WOUND CARE) ×2 IMPLANT
ELECT REM PT RETURN 9FT ADLT (ELECTROSURGICAL) ×2
ELECTRODE REM PT RTRN 9FT ADLT (ELECTROSURGICAL) ×1 IMPLANT
EXTRACTOR VACUUM M CUP 4 TUBE (SUCTIONS) IMPLANT
GLOVE BIO SURGEON STRL SZ 6.5 (GLOVE) ×2 IMPLANT
GLOVE BIOGEL PI IND STRL 7.0 (GLOVE) ×1 IMPLANT
GLOVE BIOGEL PI INDICATOR 7.0 (GLOVE) ×1
GOWN PREVENTION PLUS XLARGE (GOWN DISPOSABLE) ×4 IMPLANT
GOWN STRL REIN XL XLG (GOWN DISPOSABLE) ×4 IMPLANT
KIT ABG SYR 3ML LUER SLIP (SYRINGE) ×1 IMPLANT
NDL HYPO 25X5/8 SAFETYGLIDE (NEEDLE) ×1 IMPLANT
NEEDLE HYPO 25X5/8 SAFETYGLIDE (NEEDLE) ×2 IMPLANT
NS IRRIG 1000ML POUR BTL (IV SOLUTION) ×2 IMPLANT
PACK C SECTION WH (CUSTOM PROCEDURE TRAY) ×2 IMPLANT
PAD OB MATERNITY 4.3X12.25 (PERSONAL CARE ITEMS) ×2 IMPLANT
SLEEVE SCD COMPRESS KNEE MED (MISCELLANEOUS) ×1 IMPLANT
STAPLER VISISTAT 35W (STAPLE) IMPLANT
SUT CHROMIC 0 CT 802H (SUTURE) IMPLANT
SUT CHROMIC 0 CTX 36 (SUTURE) ×5 IMPLANT
SUT MON AB-0 CT1 36 (SUTURE) ×2 IMPLANT
SUT PDS AB 0 CTX 60 (SUTURE) ×2 IMPLANT
SUT PLAIN 0 NONE (SUTURE) IMPLANT
SUT VIC AB 4-0 KS 27 (SUTURE) ×1 IMPLANT
TOWEL OR 17X24 6PK STRL BLUE (TOWEL DISPOSABLE) ×2 IMPLANT
TRAY FOLEY CATH 14FR (SET/KITS/TRAYS/PACK) ×1 IMPLANT
WATER STERILE IRR 1000ML POUR (IV SOLUTION) ×1 IMPLANT

## 2012-12-24 NOTE — Transfer of Care (Signed)
Immediate Anesthesia Transfer of Care Note  Patient: Stacy Reeves  Procedure(s) Performed: Procedure(s): Primary Cesarean Section Twins Baby "A" Boy @ [phone], Apgars 9/9, Baby "B" Boy @ 0221, Apgars 9/9 (N/A)  Patient Location: PACU  Anesthesia Type:Spinal  Level of Consciousness: awake, alert , oriented and patient cooperative  Airway & Oxygen Therapy: Patient Spontanous Breathing  Post-op Assessment: Report given to PACU RN and Post -op Vital signs reviewed and stable  Post vital signs: Reviewed and stable  Complications: No apparent anesthesia complications

## 2012-12-24 NOTE — H&P (Signed)
32 yo G3P2 @ 37 weeks with twin pregnancy presents in labor.  No vb or lof.  + FM x 2 Pregnancy uncomplicated.    PMHx:  Asthma, colitis PSHx:  neg All: sulfa Meds:  PNV OBHx:  SVD x 2 SHx:  Negative tobacco  AF, VSS + FHT x 2 Gen - uncomfortable w/ ctx Abd - gravid, NT Ext - 1+ edema Cvx 4cm (changed from 2cm)  A/P:  Labor, twins, malpresentation of B Discussed option of labor with exp mngt vs primary c-section.  R/b/a of both reviewed.  Pt elects for primary c-section.  Does not desire BTL.  Informed consent

## 2012-12-24 NOTE — Op Note (Signed)
Cesarean Section Procedure Note  Indications: malpresentation: twins, baby b transverse  Pre-operative Diagnosis: 37 week 0 day pregnancy.  Post-operative Diagnosis: same  Surgeon: Jeremyah Jelley   Assistants: none  Anesthesia: Spinal anesthesia   Procedure Details   The patient was seen in the Holding Room. The risks, benefits, complications, treatment options, and expected outcomes were discussed with the patient.  The patient concurred with the proposed plan, giving informed consent.  The site of surgery properly noted/marked. The patient was taken to Operating Room # 9, identified as Karie Chimera and the procedure verified as C-Section Delivery. A Time Out was held and the above information confirmed.  After induction of anesthesia, the patient was draped and prepped in the usual sterile manner. A Pfannenstiel incision was made and carried down through the subcutaneous tissue to the fascia. Fascial incision was made and extended transversely. The fascia was separated from the underlying rectus tissue superiorly and inferiorly. The peritoneum was identified and entered. Peritoneal incision was extended longitudinally. The utero-vesical peritoneal reflection was incised transversely and the bladder flap was bluntly freed from the lower uterine segment. A low transverse uterine incision was made. Delivered from cephalic presentation was a vigerous  Female with Apgar scores of 9 at one minute and 9 at five minutes. After the umbilical cord was clamped and cut cord blood was obtained for evaluation.  Baby B membranes were ruptured for clear fluid and noted to be vertex.  Infant was delivered, mouth and nose suctioned and cord clamped and cut.  Infant taken to awaiting peds team. The placenta was removed intact and appeared normal. The uterine outline, tubes and ovaries appeared normal. The uterine incision was closed with running locked sutures of Chromic. Hemostasis was observed. Lavage was  carried out until clear. Peritoneum closed with monocryl.  The fascia was then reapproximated with running sutures of 0 PDS. The skin was reapproximated with 4-0 Vicryl.  Instrument, sponge, and needle counts were correct prior the abdominal closure and at the conclusion of the case.    Estimated Blood Loss:  800cc         Drains: foley, clear               Specimens: Placenta                Complications:  None; patient tolerated the procedure well.         Disposition: PACU - hemodynamically stable.         Condition: stable  Attending Attestation: I was present and scrubbed for the entire procedure.

## 2012-12-24 NOTE — Anesthesia Procedure Notes (Signed)
Spinal  Patient location during procedure: OR Preanesthetic Checklist Completed: patient identified, site marked, surgical consent, pre-op evaluation, timeout performed, IV checked, risks and benefits discussed and monitors and equipment checked Spinal Block Patient position: sitting Prep: DuraPrep Patient monitoring: heart rate, cardiac monitor, continuous pulse ox and blood pressure Approach: midline Location: L3-4 Injection technique: single-shot Needle Needle type: Sprotte  Needle gauge: 24 G Needle length: 9 cm Assessment Sensory level: T4 Additional Notes Spinal Dosage in OR  Bupivicaine ml       1.7 PFMS04   mcg        150 Fentanyl mcg            25

## 2012-12-24 NOTE — Anesthesia Postprocedure Evaluation (Signed)
  Anesthesia Post-op Note  Patient: Stacy Reeves  Procedure(s) Performed: Procedure(s): Primary Cesarean Section Twins Baby "A" Boy @ [phone], Apgars 9/9, Baby "B" Boy @ 0221, Apgars 9/9 (N/A)  Patient is awake, responsive, moving her legs, and has signs of resolution of her numbness. Pain and nausea are reasonably well controlled. Vital signs are stable and clinically acceptable. Oxygen saturation is clinically acceptable. There are no apparent anesthetic complications at this time. Patient is ready for discharge.

## 2012-12-24 NOTE — Lactation Note (Signed)
This note was copied from the chart of [facility]. Lactation Consultation Note  Patient Name: Stacy Reeves Today's Date: 12/24/2012 Reason for consult: Initial assessment Per mom baby A has been latching well . Recently had a bath and latched on after wards for 9 mins Noted consistent pattern with multiply swallows, increased with breast compressions.    Maternal Data Infant to breast within first hour of birth: Yes Has patient been taught Hand Expression?: Yes Does the patient have breastfeeding experience prior to this delivery?: Yes  Feeding Feeding Type: Breast Fed Length of feed: 9 min  LATCH Score/Interventions Latch: Grasps breast easily, tongue down, lips flanged, rhythmical sucking. Intervention(s): Assist with latch;Adjust position;Breast massage;Breast compression  Audible Swallowing: Spontaneous and intermittent  Type of Nipple: Everted at rest and after stimulation  Comfort (Breast/Nipple): Soft / non-tender     Hold (Positioning): Assistance needed to correctly position infant at breast and maintain latch. Intervention(s): Breastfeeding basics reviewed;Support Pillows;Position options;Skin to skin  LATCH Score: 9  Lactation Tools Discussed/Used     Consult Status Consult Status: Follow-up Date: 12/25/12 Follow-up type: In-patient    Myer Haff 12/24/2012, 3:13 PM

## 2012-12-24 NOTE — Lactation Note (Signed)
This note was copied from the chart of [place]. Lactation Consultation Note  Patient Name: Ricki Vanhandel [id number] Date: 12/24/2012 Reason for consult: Follow-up assessment Baby B has had blood sugar issues , improving - baby latched well at consult for 10 mins , after feeding 10 mins. Noted a consistent pattern with swallows . Mom aware of the [facility] and the [facility] O/P services.    Maternal Data Infant to breast within first hour of birth: Yes Has patient been taught Hand Expression?: Yes Does the patient have breastfeeding experience prior to this delivery?: Yes  Feeding Feeding Type: Breast Fed Length of feed: 15 min (LC observed )  LATCH Score/Interventions Latch: Grasps breast easily, tongue down, lips flanged, rhythmical sucking. Intervention(s): Adjust position;Assist with latch;Breast massage;Breast compression  Audible Swallowing: Spontaneous and intermittent Intervention(s): Skin to skin  Type of Nipple: Everted at rest and after stimulation  Comfort (Breast/Nipple): Soft / non-tender     Hold (Positioning): Assistance needed to correctly position infant at breast and maintain latch. Intervention(s): Breastfeeding basics reviewed;Support Pillows;Position options;Skin to skin  LATCH Score: 9  Lactation Tools Discussed/Used     Consult Status Consult Status: Follow-up Date: 12/25/12 Follow-up type: In-patient    Myer Haff 12/24/2012, 3:07 PM

## 2012-12-24 NOTE — Progress Notes (Signed)
Subjective: Postpartum Day 0: Cesarean Delivery Patient reports tolerating PO.    Objective: Vital signs in last 24 hours: Temp:  [97.9 F (36.6 C)-98.8 F (37.1 C)] 97.9 F (36.6 C) (10/31 0600) Pulse Rate:  [83-99] 88 (10/31 0530) Resp:  [16-21] 16 (10/31 0600) BP: (97-133)/(45-76) 111/59 mmHg (10/31 0600) SpO2:  [95 %-100 %] 95 % (10/31 0600) Weight:  [184 lb (83.462 kg)] 184 lb (83.462 kg) (10/30 2127)  Physical Exam:  General: alert and cooperative Lochia: appropriate Uterine Fundus: firm Incision: healing well DVT Evaluation: No evidence of DVT seen on physical exam. Negative Homan's sign.   Recent Labs  12/24/12 0120  HGB 13.4  HCT 38.0    Assessment/Plan: Status post Cesarean section. Doing well postoperatively.  Continue current care.  Nivek Powley G 12/24/2012, 8:11 AM

## 2012-12-24 NOTE — Anesthesia Postprocedure Evaluation (Signed)
  Anesthesia Post-op Note  Patient: Stacy Reeves  Procedure(s) Performed: Procedure(s): Primary Cesarean Section Twins Baby "A" Boy @ [phone], Apgars 9/9, Baby "B" Boy @ 0221, Apgars 9/9 (N/A)  Patient Location: Mother/Baby  Anesthesia Type:Spinal  Level of Consciousness: awake, alert  and oriented  Airway and Oxygen Therapy: Patient Spontanous Breathing  Post-op Pain: none  Post-op Assessment: Post-op Vital signs reviewed  Post-op Vital Signs: Reviewed and stable  Complications: No apparent anesthesia complications

## 2012-12-24 NOTE — Anesthesia Preprocedure Evaluation (Addendum)
Anesthesia Evaluation  Patient identified by MRN, date of birth, ID band Patient awake    Reviewed: Allergy & Precautions, H&P , Patient's Chart, lab work & pertinent test results  Airway Mallampati: II TM Distance: >3 FB Neck ROM: full    Dental  (+) Teeth Intact   Pulmonary asthma ,  breath sounds clear to auscultation        Cardiovascular Rhythm:regular Rate:Normal     Neuro/Psych    GI/Hepatic   Endo/Other    Renal/GU      Musculoskeletal   Abdominal   Peds  Hematology   Anesthesia Other Findings       Reproductive/Obstetrics (+) Pregnancy                          Anesthesia Physical Anesthesia Plan  ASA: II  Anesthesia Plan: Spinal   Post-op Pain Management:    Induction:   Airway Management Planned:   Additional Equipment:   Intra-op Plan:   Post-operative Plan:   Informed Consent: I have reviewed the patients History and Physical, chart, labs and discussed the procedure including the risks, benefits and alternatives for the proposed anesthesia with the patient or authorized representative who has indicated his/her understanding and acceptance.   Dental Advisory Given  Plan Discussed with: CRNA  Anesthesia Plan Comments: (Lab work confirmed with CRNA in room. Platelets okay. Discussed spinal anesthetic, and patient consents to the procedure:  included risk of possible headache,backache, failed block, allergic reaction, and nerve injury. This patient was asked if she had any questions or concerns before the procedure started. )        Anesthesia Quick Evaluation

## 2012-12-24 NOTE — Consult Note (Signed)
Neonatology Note:   Attendance at C-section:    I was asked by Dr. Julien Girt to attend this primary C/S at 37 1/7 weeks due to twin gestation with Twin B in breech presentation. The mother is a G3P2 A neg, GBS neg with discordant twins (twin B has measured about the 20th percentile while A has been about the 50th percentile). ROM at delivery, fluid clear for both twins.  Twin A, a female, delivered vertex and was vigorous with good spontaneous cry and tone. Needed only minimal bulb suctioning. Ap 9/9. Lungs clear to ausc in DR. To CN to care of Pediatrician.  Twin B, a female, delivered vertex and was vigorous with good spontaneous cry and tone. Needed bulb suctioning. Ap 9/9. Lungs clear to ausc in DR. This twin was smaller and a bit ruddier than his brother. BW 4# 12 oz. To CN to care of Pediatrician.    Real Cons, MD

## 2012-12-25 LAB — CBC
Hemoglobin: 12 g/dL (ref 12.0–15.0)
MCH: 33.8 pg (ref 26.0–34.0)
MCV: 95.2 fL (ref 78.0–100.0)
Platelets: 107 10*3/uL — ABNORMAL LOW (ref 150–400)
RBC: 3.55 MIL/uL — ABNORMAL LOW (ref 3.87–5.11)

## 2012-12-25 MED ORDER — RHO D IMMUNE GLOBULIN 1500 UNIT/2ML IJ SOLN
300.0000 ug | Freq: Once | INTRAMUSCULAR | Status: AC
  Administered 2012-12-26: 300 ug via INTRAMUSCULAR
  Filled 2012-12-25: qty 2

## 2012-12-25 NOTE — Lactation Note (Deleted)
This note was copied from the chart of [facility]. Lactation Consultation Note  Patient Name: Stacy Reeves Today's Date: 12/25/2012   LC follow-up of these twins born to an experienced breastfeeding mom.  Both babies are latching well, with LATCH scores of 9/10 and output wnl.  "B" has already started having green transitional stools and mom has been able to nurse them "in tandem" once today.  She denies any breastfeeding concerns and despite their late preterm status, they are feeding on cue about every 2-3 hours with one longer stretch of 4 hours last night.  Feedings are lasting for 10-20 minutes and one twin nursed for 40 minutes to make up for a missed feeding.  LC encouraged continued cue feedings and monitoring output.  Maternal Data    Feeding Feeding Type: Breast Fed Length of feed: 10 min  LATCH Score/Interventions Latch: Grasps breast easily, tongue down, lips flanged, rhythmical sucking.  Audible Swallowing: Spontaneous and intermittent  Type of Nipple: Everted at rest and after stimulation  Comfort (Breast/Nipple): Soft / non-tender     Hold (Positioning): No assistance needed to correctly position infant at breast.  LATCH Score: 10 (A)...most recent feeding for (B)=9 today, per RN assessment  Lactation Tools Discussed/Used   Cue feedings ad lib  Consult Status   LC follow-up tomorrow   Junious Dresser [facility] 12/25/2012, 10:05 PM

## 2012-12-25 NOTE — Progress Notes (Signed)
Subjective: Postpartum Day one: Cesarean Delivery Patient reports tolerating PO.    Objective: Vital signs in last 24 hours: Temp:  [98 F (36.7 C)-100.2 F (37.9 C)] 98 F (36.7 C) (11/01 6979) Pulse Rate:  [67-78] 67 (11/01 0642) Resp:  [16-20] 18 (11/01 0642) BP: (92-117)/(53-67) 99/67 mmHg (11/01 0642) SpO2:  [94 %-97 %] 94 % (10/31 2300)  Physical Exam:  General: alert Lochia: appropriate Uterine Fundus: firm Incision: healing well DVT Evaluation: No evidence of DVT seen on physical exam.   Recent Labs  12/24/12 0120 12/25/12 0535  HGB 13.4 12.0  HCT 38.0 33.8*    Assessment/Plan: Status post Cesarean section. Doing well postoperatively.  Continue current care.  Lateefa Crosby S 12/25/2012, 10:25 AM

## 2012-12-25 NOTE — Lactation Note (Signed)
This note was copied from the chart of [facility]. Lactation Consultation Note  Patient Name: Stacy Reeves Today's Date: 12/25/2012   LC follow-up of these twins born to an experienced breastfeeding mom.  Both babies are latching well, with LATCH scores of 9/10 and output wnl.  "B" has already started having green transitional stools and mom has been able to nurse them "in tandem" once today.  She denies any breastfeeding concerns and despite their late preterm status, they are feeding on cue about every 2-3 hours with one longer stretch of 4 hours last night.  Feedings are lasting for 10-20 minutes and one twin nursed for 40 minutes to make up for a missed feeding.  LC encouraged continued cue feedings and monitoring output.  Maternal Data    Feeding Feeding Type: Breast Fed Length of feed: 10 min  LATCH Score/Interventions Latch: Grasps breast easily, tongue down, lips flanged, rhythmical sucking.  Audible Swallowing: Spontaneous and intermittent  Type of Nipple: Everted at rest and after stimulation  Comfort (Breast/Nipple): Soft / non-tender     Hold (Positioning): No assistance needed to correctly position infant at breast.  LATCH Score: 10 (A)...most recent feeding for (B)=9 today, per RN assessment  Lactation Tools Discussed/Used   Cue feedings ad lib  Consult Status   LC follow-up tomorrow   Junious Dresser [facility] 12/25/2012, 10:07 PM

## 2012-12-26 ENCOUNTER — Encounter (HOSPITAL_COMMUNITY)
Admission: RE | Admit: 2012-12-26 | Discharge: 2012-12-26 | Disposition: A | Discharge status: Home / Self Care | Payer: BC Managed Care – PPO | Source: Ambulatory Visit | Attending: Obstetrics and Gynecology | Admitting: Obstetrics and Gynecology

## 2012-12-26 ENCOUNTER — Ambulatory Visit: Payer: Self-pay

## 2012-12-26 DIAGNOSIS — O923 Agalactia: Secondary | ICD-10-CM | POA: Insufficient documentation

## 2012-12-26 DIAGNOSIS — O30009 Twin pregnancy, unspecified number of placenta and unspecified number of amniotic sacs, unspecified trimester: Secondary | ICD-10-CM | POA: Diagnosis present

## 2012-12-26 DIAGNOSIS — Z98891 History of uterine scar from previous surgery: Secondary | ICD-10-CM

## 2012-12-26 MED ORDER — OXYCODONE-ACETAMINOPHEN 7.5-325 MG PO TABS: 1.0000 | ORAL_TABLET | ORAL | Status: DC | PRN

## 2012-12-26 NOTE — Progress Notes (Signed)
Subjective: Postpartum Day two: Cesarean Delivery Patient reports tolerating PO, + flatus and no problems voiding.    Objective: Vital signs in last 24 hours: Temp:  [97.9 F (36.6 C)-98.5 F (36.9 C)] 98.5 F (36.9 C) (11/02 2505) Pulse Rate:  [65] 65 (11/02 0633) Resp:  [18-20] 18 (11/02 3976) BP: (114-116)/(56-78) 114/56 mmHg (11/02 0633) SpO2:  [100 %] 100 % (11/02 7341)  Physical Exam:  General: alert Lochia: appropriate Uterine Fundus: firm Incision: healing well, no significant drainage DVT Evaluation: No evidence of DVT seen on physical exam.   Recent Labs  12/24/12 0120 12/25/12 0535  HGB 13.4 12.0  HCT 38.0 33.8*    Assessment/Plan: Status post Cesarean section. Doing well postoperatively.  Discharge home with standard precautions and return to clinic in 4-6 weeks.  Jaimarie Rapozo S 12/26/2012, 9:20 AM

## 2012-12-26 NOTE — Discharge Summary (Signed)
Obstetric Discharge Summary Reason for Admission: onset of labor with twin pregnancy. twin B transverse Prenatal Procedures: NST and ultrasound Intrapartum Procedures: cesarean: low cervical, transverse Postpartum Procedures: none Complications-Operative and Postpartum: none Hemoglobin  Date Value Range Status  12/25/2012 12.0  12.0 - 15.0 g/dL Final     HCT  Date Value Range Status  12/25/2012 33.8* 36.0 - 46.0 % Final    Physical Exam:  General: alert Lochia: appropriate Uterine Fundus: firm Incision: healing well DVT Evaluation: No evidence of DVT seen on physical exam.  Discharge Diagnoses: twin pregnacy delivered by cesarean section  Discharge Information: Date: 12/26/2012 Activity: pelvic rest Diet: routine Medications: PNV, Ibuprofen and Percocet Condition: stable Instructions: refer to practice specific booklet Discharge to: home   Newborn Data:   Tahlia, Deamer [[id number]]  Live born female  Birth Weight: 5 lb 12.1 oz (2611 g) APGAR: 9, 9   Ezell, Melikian [[id number]]  Live born female  Birth Weight: 4 lb 12.4 oz (2166 g) APGAR: 9, 9  Home with mother.  Stacy Reeves S 12/26/2012, 9:21 AM

## 2012-12-26 NOTE — Lactation Note (Signed)
This note was copied from the chart of [facility]. Lactation Consultation Note       Follow up consult with this mom of twins, now just over 37 days old. Mom reports they are both breast feeding well, as is shown by their weights and  good amounts of wet and dirty diapers.  I set up[ a  DEP for mom, since she was getting very full, and the babies were not able to soften her. She expressed 90 mls of transitional milk. I also rented mom a DEP to take home, and instructed her in it's use. Mom is aware she can call lactation for questions/concerns, and o/p consults , as needed.  Patient Name: Razan Siler [id number] Date: 12/26/2012 Reason for consult: Follow-up assessment   Maternal Data    Feeding    LATCH Score/Interventions                      Lactation Tools Discussed/Used Tools: Pump Pump Review: Setup, frequency, and cleaning;Milk Storage Initiated by:: c Nicolet Griffy rnlc Date initiated:: 12/26/12   Consult Status Consult Status: Complete Follow-up type: Call as needed    Tonna Corner 12/26/2012, 4:54 PM

## 2012-12-27 ENCOUNTER — Encounter (HOSPITAL_COMMUNITY): Payer: Self-pay | Admitting: Obstetrics and Gynecology

## 2012-12-27 LAB — RH IG WORKUP (INCLUDES ABO/RH)
Antibody Screen: POSITIVE
DAT, IgG: NEGATIVE
Fetal Screen: NEGATIVE
Unit division: 0

## 2013-01-26 ENCOUNTER — Encounter (HOSPITAL_COMMUNITY)
Admission: RE | Admit: 2013-01-26 | Discharge: 2013-01-26 | Disposition: A | Discharge status: Home / Self Care | Payer: BC Managed Care – PPO | Source: Ambulatory Visit | Attending: Obstetrics and Gynecology | Admitting: Obstetrics and Gynecology

## 2013-01-26 DIAGNOSIS — O923 Agalactia: Secondary | ICD-10-CM | POA: Insufficient documentation

## 2013-02-26 ENCOUNTER — Encounter (HOSPITAL_COMMUNITY)
Admission: RE | Admit: 2013-02-26 | Discharge: 2013-02-26 | Disposition: A | Discharge status: Home / Self Care | Payer: BC Managed Care – PPO | Source: Ambulatory Visit | Attending: Obstetrics and Gynecology | Admitting: Obstetrics and Gynecology

## 2013-02-26 DIAGNOSIS — O923 Agalactia: Secondary | ICD-10-CM | POA: Insufficient documentation

## 2013-03-29 ENCOUNTER — Encounter (HOSPITAL_COMMUNITY)
Admission: RE | Admit: 2013-03-29 | Discharge: 2013-03-29 | Disposition: A | Discharge status: Home / Self Care | Payer: BC Managed Care – PPO | Source: Ambulatory Visit | Attending: Obstetrics and Gynecology | Admitting: Obstetrics and Gynecology

## 2013-03-29 DIAGNOSIS — O923 Agalactia: Secondary | ICD-10-CM | POA: Insufficient documentation

## 2013-04-28 ENCOUNTER — Ambulatory Visit (HOSPITAL_COMMUNITY)
Admission: RE | Admit: 2013-04-28 | Discharge: 2013-04-28 | Disposition: A | Discharge status: Home / Self Care | Payer: BC Managed Care – PPO | Source: Ambulatory Visit | Attending: Obstetrics and Gynecology | Admitting: Obstetrics and Gynecology

## 2013-04-28 ENCOUNTER — Encounter (HOSPITAL_COMMUNITY)
Admission: RE | Admit: 2013-04-28 | Discharge: 2013-04-28 | Disposition: A | Discharge status: Home / Self Care | Payer: BC Managed Care – PPO | Source: Ambulatory Visit | Attending: Obstetrics and Gynecology | Admitting: Obstetrics and Gynecology

## 2013-04-28 DIAGNOSIS — O923 Agalactia: Secondary | ICD-10-CM | POA: Insufficient documentation

## 2013-04-28 NOTE — Lactation Note (Signed)
Adult Lactation Consultation Outpatient Visit Note  Patient Name: Stacy Reeves (Web) DOB 12/24/2011  Stacy Reeves (Mac) DOB 12/24/2011 Date of Birth: May 11, 1980     Birth Weight 5-12.1     Birth Weight 4-12.4 Gestational Age at Delivery: Unknown   D/C Weight 5-4.7 (-8%)    D/C Weight 4-8.3 (-5%) Type of Delivery: C/S  Breastfeeding History: Frequency of Breastfeeding: Both babies breast feed every 2 to 3 hours day and night Length of Feeding: 10 to 15 minutes Voids: Both babies >5  Stools: Both babies 3 to 4 yellow  Supplementing / Method: None Pumping:  Type of Pump: Medela Symphony   Frequency: Infrequent and not regular  Volume:  Not known  Initial Feeding Assessment William Pre-feed Weight: 5090g Post-feed Weight: 5252g Amount Transferred:  162 ml (5.4 ounces) Comments: Stacy Reeves breast fed very well for about 15 minutes with obvious frequent swallowing.   Initial Feeding Assessment Stacy Reeves: Pre-feed Weight: 4830g Post-feed Weight: 4900g Amount Transferred: 70 ml (2.3 ounces) Comments: Stacy Reeves breast fed very well for about 15 minutes with obvious frequent swallowing.    Additional Feeding Assessment Stacy Reeves: Pre-feed Weight: 4900 g Post-feed Weight: 4900 g Amount Transferred: 0 Comments: Stacy Reeves was offered the breast again. He latched for a minute but did not take any. He was satisfied with 2.3 ounces.    Consultation Evaluation:  Stacy Reeves states that both babies have always fed well. Stacy Reeves states they feed at the same time without difficulty. Stacy Reeves denies breast pain or concerns.  Stacy Reeves requested [facility] appointment based on recommendation from the babies' pediatric appointment yesterday. The babies had gained about half ounce per day in the last month, and feeding assessment was requested.  Stacy Reeves reports that babies had been feeding about every 2 to 2.5 hours during the day and going about 6 to 7 hours at night without a feeding. Then the babies were being treated  for ear infections, resulting in a period of difficult feeding followed by more frequent feeding. Babies' last dose of abx is today, and the babies continue to feed every 2 to 2.5 hours during the day, and every 2 to 2.5 hours at night as well.   Based on the age of the babies (4 months), a weight gain of 1/2 ounce per day is within low border of normal. Stacy Reeves took 70 ml during this feeding, which is also in the low border of normal. He was offered more but refused.   Encouraged Stacy Reeves to continue cue based feeding, even at night.  Encouraged Stacy Reeves to get some rest whenever possible. Encouraged Stacy Reeves to pump for 15 minutes after feeding, one or more times per day as is reasonable with her other duties. Encouraged Stacy Reeves to consider Fenugreek or Mother's Love tea.  Encouraged Stacy Reeves to attend the Breast feeding support group.  Instructed Stacy Reeves to watch for signs of thrush: breast pain, burning, itching, and to call if she has any concerns.  Written recommendations provided for Stacy Reeves. Stacy Reeves states she is encouraged and confident.  Follow-Up PRN    [facility] 04/28/2013, 11:41 AM

## 2013-05-29 ENCOUNTER — Encounter (HOSPITAL_COMMUNITY)
Admission: RE | Admit: 2013-05-29 | Discharge: 2013-05-29 | Disposition: A | Discharge status: Home / Self Care | Payer: BC Managed Care – PPO | Source: Ambulatory Visit | Attending: Obstetrics and Gynecology | Admitting: Obstetrics and Gynecology

## 2013-05-29 DIAGNOSIS — O923 Agalactia: Secondary | ICD-10-CM | POA: Insufficient documentation

## 2013-06-28 ENCOUNTER — Encounter (HOSPITAL_COMMUNITY)
Admission: RE | Admit: 2013-06-28 | Discharge: 2013-06-28 | Disposition: A | Discharge status: Home / Self Care | Payer: BC Managed Care – PPO | Source: Ambulatory Visit | Attending: Obstetrics and Gynecology | Admitting: Obstetrics and Gynecology

## 2013-06-28 DIAGNOSIS — O923 Agalactia: Secondary | ICD-10-CM | POA: Insufficient documentation

## 2013-07-29 ENCOUNTER — Encounter (HOSPITAL_COMMUNITY)
Admission: RE | Admit: 2013-07-29 | Discharge: 2013-07-29 | Disposition: A | Discharge status: Home / Self Care | Payer: BC Managed Care – PPO | Source: Ambulatory Visit | Attending: Obstetrics and Gynecology | Admitting: Obstetrics and Gynecology

## 2013-07-29 DIAGNOSIS — O923 Agalactia: Secondary | ICD-10-CM | POA: Insufficient documentation

## 2013-12-26 ENCOUNTER — Encounter (HOSPITAL_COMMUNITY): Payer: Self-pay | Admitting: Obstetrics and Gynecology

## 2014-02-14 ENCOUNTER — Other Ambulatory Visit: Payer: Self-pay | Admitting: Obstetrics and Gynecology

## 2014-02-15 LAB — CYTOLOGY - PAP

## 2015-03-28 ENCOUNTER — Telehealth: Payer: Self-pay | Admitting: Cardiovascular Disease

## 2015-03-28 NOTE — Telephone Encounter (Signed)
Received records from [facility] for appointment on 04/06/15 with Dr Gwenlyn Found..  Records given to [facility] (medical records) for Dr Kennon Holter schedule on 04/06/15. lp

## 2015-04-06 ENCOUNTER — Ambulatory Visit (INDEPENDENT_AMBULATORY_CARE_PROVIDER_SITE_OTHER): Payer: BLUE CROSS/BLUE SHIELD | Admitting: Cardiovascular Disease

## 2015-04-06 ENCOUNTER — Encounter: Payer: Self-pay | Admitting: Cardiovascular Disease

## 2015-04-06 VITALS — BP 122/88 | HR 72 | Ht 66.0 in | Wt 124.0 lb

## 2015-04-06 DIAGNOSIS — R072 Precordial pain: Secondary | ICD-10-CM | POA: Diagnosis not present

## 2015-04-06 DIAGNOSIS — R0789 Other chest pain: Secondary | ICD-10-CM

## 2015-04-06 NOTE — Patient Instructions (Signed)
Schedule Treadmill    Follow as needed

## 2015-04-06 NOTE — Assessment & Plan Note (Signed)
Stacy Reeves is a 35 year old thin and fit-appearing married Caucasian female mother of 4 children referred by Dr. Jackelyn Poling for atypical chest pain. She has no chronic risk factors. She is active and exercises rregularly. The pain began 2 years ago when she had her twins. Occur several times a month unless her Tenormin 50 minutes at a time. It is associated with some shortness of breath and lightheadedness. I suspect this is anxiety related and doubt that there is an ischemic component. I am going to get a routine GXT  To rule out CAD.

## 2015-04-06 NOTE — Progress Notes (Signed)
     04/06/2015 Karie Chimera   [birth year]  [id number]  Primary Physician Velna Hatchet, MD Primary Cardiologist: Lorretta Harp MD Renae Gloss   HPI:  Mrs. Andujo is a delightful 35 year old thin and fit-appearing married Caucasian female mother of 4 children ages 77, 40 and 2 (twins) was referred by her primary care physician for cardiovascular evaluation because of atypical chest pain. She is a stay-at-home mom. She exercises regularly. She has no chronic risk factors. The pain began 2 years ago when she had twins. Occur several times a month lasting 10-15 minutes a time associated with some shortness of breath and dizziness. Her symptoms sound anxiety related. I doubt that they are cardiovascular in nature.   Current Outpatient Prescriptions  Medication Sig Dispense Refill  . Multiple Vitamin (MULTIVITAMIN) tablet Take 1 tablet by mouth daily.     No current facility-administered medications for this visit.    Allergies  Allergen Reactions  . Sulfa Antibiotics Rash    Social History   Social History  . Marital Status: Married    Spouse Name: N/A  . Number of Children: N/A  . Years of Education: N/A   Occupational History  . Not on file.   Social History Main Topics  . Smoking status: Never Smoker   . Smokeless tobacco: Never Used  . Alcohol Use: No  . Drug Use: No  . Sexual Activity: Yes   Other Topics Concern  . Not on file   Social History Narrative   2 small children at home no ets. 34 month old and 34 yo    Married   Non smoker     Review of Systems: General: negative for chills, fever, night sweats or weight changes.  Cardiovascular: negative for chest pain, dyspnea on exertion, edema, orthopnea, palpitations, paroxysmal nocturnal dyspnea or shortness of breath Dermatological: negative for rash Respiratory: negative for cough or wheezing Urologic: negative for hematuria Abdominal: negative for nausea, vomiting, diarrhea, bright red  blood per rectum, melena, or hematemesis Neurologic: negative for visual changes, syncope, or dizziness All other systems reviewed and are otherwise negative except as noted above.    Blood pressure 122/88, pulse 72, height 5' 6"  (1.676 m), weight 124 lb (56.246 kg), unknown if currently breastfeeding.  General appearance: alert and no distress Neck: no adenopathy, no carotid bruit, no JVD, supple, symmetrical, trachea midline and thyroid not enlarged, symmetric, no tenderness/mass/nodules Lungs: clear to auscultation bilaterally Heart: regular rate and rhythm, S1, S2 normal, no murmur, click, rub or gallop Extremities: extremities normal, atraumatic, no cyanosis or edema  EKG nnormal sinus rhythm at 72 with a ST or T-wave changes. I personally reviewed this EKG  ASSESSMENT AND PLAN:   Atypical chest pain Mrs. Osgood is a 35 year old thin and fit-appearing married Caucasian female mother of 4 children referred by Dr. Jackelyn Poling for atypical chest pain. She has no chronic risk factors. She is active and exercises rregularly. The pain began 2 years ago when she had her twins. Occur several times a month unless her Tenormin 50 minutes at a time. It is associated with some shortness of breath and lightheadedness. I suspect this is anxiety related and doubt that there is an ischemic component. I am going to get a routine GXT  To rule out CAD.      Lorretta Harp MD FACP,FACC,FAHA, [facility] 04/06/2015 8:13 AM

## 2015-04-24 ENCOUNTER — Telehealth (HOSPITAL_COMMUNITY): Payer: Self-pay

## 2015-04-24 NOTE — Telephone Encounter (Signed)
Encounter complete. 

## 2015-04-26 ENCOUNTER — Ambulatory Visit (HOSPITAL_COMMUNITY)
Admission: RE | Admit: 2015-04-26 | Discharge: 2015-04-26 | Disposition: A | Discharge status: Home / Self Care | Payer: BLUE CROSS/BLUE SHIELD | Source: Ambulatory Visit | Attending: Cardiology | Admitting: Cardiology

## 2015-04-26 DIAGNOSIS — R072 Precordial pain: Secondary | ICD-10-CM | POA: Diagnosis not present

## 2015-04-26 LAB — EXERCISE TOLERANCE TEST
Estimated workload: 18.7 METS
Exercise duration (min): 16 min
Exercise duration (sec): 0 s
MPHR: 187 {beats}/min
Peak HR: 184 {beats}/min
Percent HR: 98 %
RPE: 17
Rest HR: 60 {beats}/min

## 2015-07-12 DIAGNOSIS — D2272 Melanocytic nevi of left lower limb, including hip: Secondary | ICD-10-CM | POA: Diagnosis not present

## 2015-07-12 DIAGNOSIS — D2239 Melanocytic nevi of other parts of face: Secondary | ICD-10-CM | POA: Diagnosis not present

## 2015-07-12 DIAGNOSIS — D225 Melanocytic nevi of trunk: Secondary | ICD-10-CM | POA: Diagnosis not present

## 2015-10-08 ENCOUNTER — Telehealth: Payer: Self-pay | Admitting: Gastroenterology

## 2015-10-08 NOTE — Telephone Encounter (Signed)
The pt states she has a feeling of something in her throat occasionally when she eats.  Would like an appt with Dr Ardis Hughs.  Appt scheduled for 12/18/15, she will chew her food well and take small bites and call if she worsens.

## 2015-11-29 DIAGNOSIS — J029 Acute pharyngitis, unspecified: Secondary | ICD-10-CM | POA: Diagnosis not present

## 2015-12-04 DIAGNOSIS — Z23 Encounter for immunization: Secondary | ICD-10-CM | POA: Diagnosis not present

## 2015-12-18 ENCOUNTER — Ambulatory Visit: Payer: BLUE CROSS/BLUE SHIELD | Admitting: Gastroenterology

## 2016-02-07 DIAGNOSIS — H6502 Acute serous otitis media, left ear: Secondary | ICD-10-CM | POA: Diagnosis not present

## 2016-02-07 DIAGNOSIS — H6982 Other specified disorders of Eustachian tube, left ear: Secondary | ICD-10-CM | POA: Diagnosis not present

## 2016-03-11 DIAGNOSIS — Z0000 Encounter for general adult medical examination without abnormal findings: Secondary | ICD-10-CM | POA: Diagnosis not present

## 2016-03-18 DIAGNOSIS — Z0000 Encounter for general adult medical examination without abnormal findings: Secondary | ICD-10-CM | POA: Diagnosis not present

## 2016-03-18 DIAGNOSIS — Z6820 Body mass index (BMI) 20.0-20.9, adult: Secondary | ICD-10-CM | POA: Diagnosis not present

## 2016-03-18 DIAGNOSIS — R0789 Other chest pain: Secondary | ICD-10-CM | POA: Diagnosis not present

## 2016-03-18 DIAGNOSIS — Z1389 Encounter for screening for other disorder: Secondary | ICD-10-CM | POA: Diagnosis not present

## 2016-03-18 DIAGNOSIS — G43909 Migraine, unspecified, not intractable, without status migrainosus: Secondary | ICD-10-CM | POA: Diagnosis not present

## 2016-03-24 DIAGNOSIS — J069 Acute upper respiratory infection, unspecified: Secondary | ICD-10-CM | POA: Diagnosis not present

## 2016-03-25 DIAGNOSIS — R52 Pain, unspecified: Secondary | ICD-10-CM | POA: Diagnosis not present

## 2016-06-04 DIAGNOSIS — N6459 Other signs and symptoms in breast: Secondary | ICD-10-CM | POA: Diagnosis not present

## 2016-06-05 ENCOUNTER — Other Ambulatory Visit: Payer: Self-pay | Admitting: Obstetrics and Gynecology

## 2016-06-05 DIAGNOSIS — N644 Mastodynia: Secondary | ICD-10-CM

## 2016-06-10 ENCOUNTER — Ambulatory Visit
Admission: RE | Admit: 2016-06-10 | Discharge: 2016-06-10 | Disposition: A | Discharge status: Home / Self Care | Payer: BLUE CROSS/BLUE SHIELD | Source: Ambulatory Visit | Attending: Obstetrics and Gynecology | Admitting: Obstetrics and Gynecology

## 2016-06-10 ENCOUNTER — Other Ambulatory Visit: Payer: BLUE CROSS/BLUE SHIELD

## 2016-06-10 DIAGNOSIS — N644 Mastodynia: Secondary | ICD-10-CM

## 2016-08-07 DIAGNOSIS — Z681 Body mass index (BMI) 19 or less, adult: Secondary | ICD-10-CM | POA: Diagnosis not present

## 2016-08-07 DIAGNOSIS — R42 Dizziness and giddiness: Secondary | ICD-10-CM | POA: Diagnosis not present

## 2016-08-28 DIAGNOSIS — Z0100 Encounter for examination of eyes and vision without abnormal findings: Secondary | ICD-10-CM | POA: Diagnosis not present

## 2016-10-31 DIAGNOSIS — Z23 Encounter for immunization: Secondary | ICD-10-CM | POA: Diagnosis not present

## 2016-10-31 DIAGNOSIS — Z6820 Body mass index (BMI) 20.0-20.9, adult: Secondary | ICD-10-CM | POA: Diagnosis not present

## 2016-10-31 DIAGNOSIS — J328 Other chronic sinusitis: Secondary | ICD-10-CM | POA: Diagnosis not present

## 2016-11-06 ENCOUNTER — Other Ambulatory Visit: Payer: Self-pay | Admitting: Internal Medicine

## 2016-11-06 DIAGNOSIS — J329 Chronic sinusitis, unspecified: Secondary | ICD-10-CM | POA: Diagnosis not present

## 2016-11-06 DIAGNOSIS — Z6820 Body mass index (BMI) 20.0-20.9, adult: Secondary | ICD-10-CM | POA: Diagnosis not present

## 2016-11-11 ENCOUNTER — Ambulatory Visit
Admission: RE | Admit: 2016-11-11 | Discharge: 2016-11-11 | Disposition: A | Discharge status: Home / Self Care | Payer: BLUE CROSS/BLUE SHIELD | Source: Ambulatory Visit | Attending: Internal Medicine | Admitting: Internal Medicine

## 2016-11-11 DIAGNOSIS — J329 Chronic sinusitis, unspecified: Secondary | ICD-10-CM

## 2016-11-11 DIAGNOSIS — J029 Acute pharyngitis, unspecified: Secondary | ICD-10-CM | POA: Diagnosis not present

## 2016-11-21 DIAGNOSIS — G43909 Migraine, unspecified, not intractable, without status migrainosus: Secondary | ICD-10-CM | POA: Diagnosis not present

## 2016-11-21 DIAGNOSIS — J342 Deviated nasal septum: Secondary | ICD-10-CM | POA: Insufficient documentation

## 2016-11-21 DIAGNOSIS — J341 Cyst and mucocele of nose and nasal sinus: Secondary | ICD-10-CM | POA: Diagnosis not present

## 2016-12-11 DIAGNOSIS — G43111 Migraine with aura, intractable, with status migrainosus: Secondary | ICD-10-CM | POA: Diagnosis not present

## 2016-12-11 DIAGNOSIS — G43019 Migraine without aura, intractable, without status migrainosus: Secondary | ICD-10-CM | POA: Diagnosis not present

## 2016-12-11 DIAGNOSIS — Z049 Encounter for examination and observation for unspecified reason: Secondary | ICD-10-CM | POA: Diagnosis not present

## 2017-01-20 DIAGNOSIS — G43019 Migraine without aura, intractable, without status migrainosus: Secondary | ICD-10-CM | POA: Diagnosis not present

## 2017-01-20 DIAGNOSIS — G43111 Migraine with aura, intractable, with status migrainosus: Secondary | ICD-10-CM | POA: Diagnosis not present

## 2017-01-30 DIAGNOSIS — Z01419 Encounter for gynecological examination (general) (routine) without abnormal findings: Secondary | ICD-10-CM | POA: Diagnosis not present

## 2017-01-30 DIAGNOSIS — Z681 Body mass index (BMI) 19 or less, adult: Secondary | ICD-10-CM | POA: Diagnosis not present

## 2017-02-16 DIAGNOSIS — J00 Acute nasopharyngitis [common cold]: Secondary | ICD-10-CM | POA: Diagnosis not present

## 2017-03-17 DIAGNOSIS — Z0000 Encounter for general adult medical examination without abnormal findings: Secondary | ICD-10-CM | POA: Diagnosis not present

## 2017-03-24 DIAGNOSIS — G43909 Migraine, unspecified, not intractable, without status migrainosus: Secondary | ICD-10-CM | POA: Diagnosis not present

## 2017-03-24 DIAGNOSIS — R002 Palpitations: Secondary | ICD-10-CM | POA: Diagnosis not present

## 2017-03-24 DIAGNOSIS — Z0000 Encounter for general adult medical examination without abnormal findings: Secondary | ICD-10-CM | POA: Diagnosis not present

## 2017-03-24 DIAGNOSIS — Z681 Body mass index (BMI) 19 or less, adult: Secondary | ICD-10-CM | POA: Diagnosis not present

## 2017-03-24 DIAGNOSIS — Z1389 Encounter for screening for other disorder: Secondary | ICD-10-CM | POA: Diagnosis not present

## 2017-04-21 DIAGNOSIS — R51 Headache: Secondary | ICD-10-CM | POA: Diagnosis not present

## 2017-04-21 DIAGNOSIS — G43111 Migraine with aura, intractable, with status migrainosus: Secondary | ICD-10-CM | POA: Diagnosis not present

## 2017-04-21 DIAGNOSIS — M542 Cervicalgia: Secondary | ICD-10-CM | POA: Diagnosis not present

## 2017-04-21 DIAGNOSIS — G43019 Migraine without aura, intractable, without status migrainosus: Secondary | ICD-10-CM | POA: Diagnosis not present

## 2017-04-23 DIAGNOSIS — D225 Melanocytic nevi of trunk: Secondary | ICD-10-CM | POA: Diagnosis not present

## 2017-04-28 ENCOUNTER — Encounter: Payer: Self-pay | Admitting: Gastroenterology

## 2017-04-28 ENCOUNTER — Encounter: Payer: Self-pay | Admitting: Physician Assistant

## 2017-04-28 NOTE — Telephone Encounter (Signed)
Error

## 2017-05-18 ENCOUNTER — Encounter: Payer: Self-pay | Admitting: Physician Assistant

## 2017-05-18 ENCOUNTER — Ambulatory Visit (INDEPENDENT_AMBULATORY_CARE_PROVIDER_SITE_OTHER): Payer: BLUE CROSS/BLUE SHIELD | Admitting: Physician Assistant

## 2017-05-18 VITALS — BP 130/80 | HR 80 | Ht 66.0 in | Wt 123.4 lb

## 2017-05-18 DIAGNOSIS — K625 Hemorrhage of anus and rectum: Secondary | ICD-10-CM | POA: Diagnosis not present

## 2017-05-18 DIAGNOSIS — R634 Abnormal weight loss: Secondary | ICD-10-CM

## 2017-05-18 DIAGNOSIS — R194 Change in bowel habit: Secondary | ICD-10-CM | POA: Diagnosis not present

## 2017-05-18 DIAGNOSIS — Z8719 Personal history of other diseases of the digestive system: Secondary | ICD-10-CM

## 2017-05-18 MED ORDER — PEG 3350-KCL-NA BICARB-NACL 420 G PO SOLR: 4000.0000 mL | Freq: Once | ORAL | 0 refills | Status: AC

## 2017-05-18 MED ORDER — MESALAMINE 1000 MG RE SUPP: 1000.0000 mg | Freq: Every day | RECTAL | 4 refills | Status: DC

## 2017-05-18 NOTE — Patient Instructions (Signed)
If you are age 36 or older, your body mass index should be between 23-30. Your Body mass index is 19.91 kg/m. If this is out of the aforementioned range listed, please consider follow up with your Primary Care Provider.  If you are age 43 or younger, your body mass index should be between 19-25. Your Body mass index is 19.91 kg/m. If this is out of the aformentioned range listed, please consider follow up with your Primary Care Provider.   We have sent the following medications to your pharmacy for you to pick up at your convenience: Canasa insert 1 suppository once daily at bedtime.  You have been scheduled for a colonoscopy. Please follow written instructions given to you at your visit today.  Please pick up your prep supplies at the pharmacy within the next 1-3 days. If you use inhalers (even only as needed), please bring them with you on the day of your procedure. Your physician has requested that you go to www.startemmi.com and enter the access code given to you at your visit today. This web site gives a general overview about your procedure. However, you should still follow specific instructions given to you by our office regarding your preparation for the procedure.

## 2017-05-18 NOTE — Progress Notes (Signed)
I agree with the above note, plan 

## 2017-05-18 NOTE — Progress Notes (Signed)
Subjective:    Patient ID: Stacy Reeves, female    DOB: 04/23/1980, 37 y.o.   MRN: [id number]  HPI Stacy Reeves is a pleasant 37 year old white female known to Dr. Ardis Hughs.  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  She last had colonoscopy here in 2010 which showed 6-7 cm of inflammation in the distal rectum.  Biopsies showed chronic minimally active colitis.  She has been treated with Canasa suppositories in the past. Patient is not been seen here since 2011, and says she has not had any problems with her proctitis in several years.  She was started on imipramine in the fall 2018 for chronic headaches and says she has developed some constipation since then.  She is also concerned because she has been losing weight over the past 3 months.  She is down between 5-8 pounds.  Her appetite has been good, she has not changed her diet or activity level.  She has occasionally been getting some sharp lower abdominal pain he feels is "like a gas bubble".  She is also been having some discomfort in her very low back.  She has not had any pain with bowel movements.  She has been noticing a small amount of bright red blood mixed with her bowel movements off and on over the past 2 months. She had been seen by Dr. Lodema Hong her PCP in January and had baseline labs done including TSH all of which were unremarkable.  Review of Systems Pertinent positive and negative review of systems were noted in the above HPI section.  All other review of systems was otherwise negative.  Outpatient Encounter Medications as of 05/18/2017  Medication Sig  . eletriptan (RELPAX) 40 MG tablet Take 40 mg by mouth as needed for migraine or headache. May repeat in 2 hours if headache persists or recurs.  Marland Kitchen imipramine (TOFRANIL) 50 MG tablet Take 50 mg by mouth at bedtime.  . Multiple Vitamin (MULTIVITAMIN) tablet Take 1 tablet by mouth daily.  . mesalamine (CANASA) 1000 MG suppository Place 1 suppository (1,000 mg  total) rectally at bedtime. Take once daily for one month.  . polyethylene glycol-electrolytes (NULYTELY/GOLYTELY) 420 g solution Take 4,000 mLs by mouth once for 1 dose.   No facility-administered encounter medications on file as of 05/18/2017.    Allergies  Allergen Reactions  . Sulfa Antibiotics Rash   Patient Active Problem List   Diagnosis Date Noted  . Atypical chest pain 04/06/2015  . Twin pregnancy 12/26/2012    Class: Present on Admission  . Status post cesarean section 12/26/2012    Class: Status post  . Twin pregnancy, antepartum 11/24/2012  . Acute pharyngitis 09/12/2011  . Fever 09/12/2011  . Infection, virus, coxsackie probable 09/12/2011  . SOB (shortness of breath) 04/21/2011  . Acute respiratory infection 04/21/2011  . NSVD (normal spontaneous vaginal delivery) 12/17/2010  . SINUSITIS - ACUTE-NOS 01/09/2010  . RECTAL BLEEDING 06/02/2008  . ALLERGIC RHINITIS 09/21/2006  . REACTIVE AIRWAY DISEASE 09/21/2006   Social History   Socioeconomic History  . Marital status: Married    Spouse name: Not on file  . Number of children: 4  . Years of education: Not on file  . Highest education level: Not on file  Occupational History  . Occupation: stay home mom  Social Needs  . Financial resource strain: Not on file  . Food insecurity:    Worry: Not on file    Inability: Not on file  . Transportation needs:  Medical: Not on file    Non-medical: Not on file  Tobacco Use  . Smoking status: Never Smoker  . Smokeless tobacco: Never Used  Substance and Sexual Activity  . Alcohol use: No  . Drug use: No  . Sexual activity: Yes  Lifestyle  . Physical activity:    Days per week: Not on file    Minutes per session: Not on file  . Stress: Not on file  Relationships  . Social connections:    Talks on phone: Not on file    Gets together: Not on file    Attends religious service: Not on file    Active member of club or organization: Not on file    Attends  meetings of clubs or organizations: Not on file    Relationship status: Not on file  . Intimate partner violence:    Fear of current or ex partner: Not on file    Emotionally abused: Not on file    Physically abused: Not on file    Forced sexual activity: Not on file  Other Topics Concern  . Not on file  Social History Narrative   2 small children at home no ets. 50 month old and 36 yo    Married   Non smoker    Ms. Lamountain's family history includes Cancer in her maternal grandfather and maternal grandmother; Colon polyps in her mother; Hyperlipidemia in her father; Hypertension in her mother; Irritable bowel syndrome in her mother; Lung cancer in her paternal grandfather.      Objective:    Vitals:   05/18/17 1026  BP: 130/80  Pulse: 80    Physical Exam; well-developed young white female in no acute distress, pleasant blood pressure 130/80 pulse 80, height 5 foot 6, weight 123, BMI 19.9.  HEENT;nontraumatic normocephalic EOMI PERRLA sclera anicteric, Cardiovascular; regular rate and rhythm with S1-S2 no murmur rub or gallop, Pulmonary ;clear bilaterally, Abdomen; soft, nontender nondistended bowel sounds are active there is no palpable mass or hepatosplenomegaly bowel sounds are present, Rectal ;exam not done, Extremities ;no clubbing cyanosis or edema skin warm and dry, Neuro; psych mood and affect appropriate       Assessment & Plan:   #73 37 year old white female with history of proctitis, last seen here in 2011, who presents with intermittent blood mixed with bowel movements over the past couple of months, some very low abdominal pain and very low back pain as well as weight loss of 5-8 pounds over the past 2-3 months. Rule out exacerbation of proctitis, development of colitis, rule out occult colon lesion  #2 status post C-section #3.  Reactive airway disease  Plan; start Canasa suppositories 1 per rectum nightly Patient will be scheduled for colonoscopy with Dr. Ardis Hughs.   Procedure was discussed in detail with the patient including indications risks and benefits and she is agreeable to proceed. If colonoscopy is unrevealing she will need further imaging likely with CT abdomen and pelvis.  Hester Joslin S Brad Mcgaughy PA-C 05/18/2017   Cc: Velna Hatchet, MD

## 2017-05-27 ENCOUNTER — Encounter: Payer: Self-pay | Admitting: Gastroenterology

## 2017-06-10 ENCOUNTER — Other Ambulatory Visit: Payer: Self-pay

## 2017-06-10 ENCOUNTER — Ambulatory Visit (AMBULATORY_SURGERY_CENTER): Payer: BLUE CROSS/BLUE SHIELD | Admitting: Gastroenterology

## 2017-06-10 ENCOUNTER — Encounter: Payer: Self-pay | Admitting: Gastroenterology

## 2017-06-10 VITALS — BP 109/72 | HR 67 | Temp 98.4°F | Resp 15 | Ht 66.0 in | Wt 123.0 lb

## 2017-06-10 DIAGNOSIS — K6289 Other specified diseases of anus and rectum: Secondary | ICD-10-CM | POA: Diagnosis not present

## 2017-06-10 DIAGNOSIS — K921 Melena: Secondary | ICD-10-CM

## 2017-06-10 DIAGNOSIS — Z8719 Personal history of other diseases of the digestive system: Secondary | ICD-10-CM

## 2017-06-10 DIAGNOSIS — K625 Hemorrhage of anus and rectum: Secondary | ICD-10-CM

## 2017-06-10 MED ORDER — SODIUM CHLORIDE 0.9 % IV SOLN: 500.0000 mL | Freq: Once | INTRAVENOUS | Status: DC

## 2017-06-10 NOTE — Progress Notes (Signed)
To recovery, report to RN, VSS. 

## 2017-06-10 NOTE — Op Note (Addendum)
[place] Patient Name: Sache Sane Procedure Date: 06/10/2017 8:47 AM MRN: [id number] Endoscopist: Milus Banister , MD Age: 37 Referring MD:  Date of Birth: 02-05-1981 Gender: Female Account #: [id number] Procedure:                Colonoscopy Indications:              Hematochezia Medicines:                Monitored Anesthesia Care Procedure:                Pre-Anesthesia Assessment:                           - Prior to the procedure, a History and Physical                            was performed, and patient medications and                            allergies were reviewed. The patient's tolerance of                            previous anesthesia was also reviewed. The risks                            and benefits of the procedure and the sedation                            options and risks were discussed with the patient.                            All questions were answered, and informed consent                            was obtained. Prior Anticoagulants: The patient has                            taken no previous anticoagulant or antiplatelet                            agents. ASA Grade Assessment: II - A patient with                            mild systemic disease. After reviewing the risks                            and benefits, the patient was deemed in                            satisfactory condition to undergo the procedure.                           - Prior to the procedure, a History and Physical  was performed, and patient medications and                            allergies were reviewed. The patient's tolerance of                            previous anesthesia was also reviewed. The risks                            and benefits of the procedure and the sedation                            options and risks were discussed with the patient.                            All questions were answered, and informed consent                        was obtained. Prior Anticoagulants: The patient has                            taken no previous anticoagulant or antiplatelet                            agents. ASA Grade Assessment: II - A patient with                            mild systemic disease. After reviewing the risks                            and benefits, the patient was deemed in                            satisfactory condition to undergo the procedure.                           After obtaining informed consent, the colonoscope                            was passed under direct vision. Throughout the                            procedure, the patient's blood pressure, pulse, and                            oxygen saturations were monitored continuously. The                            Colonoscope was introduced through the anus and                            advanced to the the cecum, identified by  appendiceal orifice and ileocecal valve. The                            colonoscopy was performed without difficulty. The                            patient tolerated the procedure well. The quality                            of the bowel preparation was good. The ileocecal                            valve, appendiceal orifice, and rectum were                            photographed. Scope In: 8:57:55 AM Scope Out: 9:16:07 AM Scope Withdrawal Time: 0 hours 7 minutes 37 seconds  Total Procedure Duration: 0 hours 18 minutes 12 seconds  Findings:                 There was minimal erythema in the rectum without                            clear distinction to normal mucosa. Biopsies were                            taken.                           Small internal hemorrhoids.                           The entire examined colon otherwise appeared normal                            on direct and retroflexion views. Complications:            No immediate complications. Estimated blood loss:                             None. Estimated Blood Loss:     Estimated blood loss: none. Estimated blood loss:                            none. Impression:               - Mild erythema in the rectum without clear                            demarcation to normal mucosa. Biopsies taken. ?                            VERY mild inflammation.                           - Small internal hemorrhoids. Recommendation:           - Patient has a contact  number available for                            emergencies. The signs and symptoms of potential                            delayed complications were discussed with the                            patient. Return to normal activities tomorrow.                            Written discharge instructions were provided to the                            patient.                           - Resume previous diet.                           - Continue present medications.                           - Await pathology results. Milus Banister, MD 06/10/2017 9:20:27 AM This report has been signed electronically.

## 2017-06-10 NOTE — Progress Notes (Signed)
Called to room to assist during endoscopic procedure.  Patient ID and intended procedure confirmed with present staff. Received instructions for my participation in the procedure from the performing physician.  

## 2017-06-10 NOTE — Patient Instructions (Signed)
YOU HAD AN ENDOSCOPIC PROCEDURE TODAY AT [facility]:   Refer to the procedure report that was given to you for any specific questions about what was found during the examination.  If the procedure report does not answer your questions, please call your gastroenterologist to clarify.  If you requested that your care partner not be given the details of your procedure findings, then the procedure report has been included in a sealed envelope for you to review at your convenience later.  YOU SHOULD EXPECT: Some feelings of bloating in the abdomen. Passage of more gas than usual.  Walking can help get rid of the air that was put into your GI tract during the procedure and reduce the bloating. If you had a lower endoscopy (such as a colonoscopy or flexible sigmoidoscopy) you may notice spotting of blood in your stool or on the toilet paper. If you underwent a bowel prep for your procedure, you may not have a normal bowel movement for a few days.  Please Note:  You might notice some irritation and congestion in your nose or some drainage.  This is from the oxygen used during your procedure.  There is no need for concern and it should clear up in a day or so.  SYMPTOMS TO REPORT IMMEDIATELY:   Following lower endoscopy (colonoscopy or flexible sigmoidoscopy):  Excessive amounts of blood in the stool  Significant tenderness or worsening of abdominal pains  Swelling of the abdomen that is new, acute  Fever of 100F or higher   For urgent or emergent issues, a gastroenterologist can be reached at any hour by calling [phone].   DIET:  We do recommend a small meal at first, but then you may proceed to your regular diet.  Drink plenty of fluids but you should avoid alcoholic beverages for 24 hours.  ACTIVITY:  You should plan to take it easy for the rest of today and you should NOT DRIVE or use heavy machinery until tomorrow (because of the sedation medicines used during the test).     FOLLOW UP: Our staff will call the number listed on your records the next business day following your procedure to check on you and address any questions or concerns that you may have regarding the information given to you following your procedure. If we do not reach you, we will leave a message.  However, if you are feeling well and you are not experiencing any problems, there is no need to return our call.  We will assume that you have returned to your regular daily activities without incident.  If any biopsies were taken you will be contacted by phone or by letter within the next 1-3 weeks.  Please call us at [phone] if you have not heard about the biopsies in 3 weeks.    SIGNATURES/CONFIDENTIALITY: You and/or your care partner have signed paperwork which will be entered into your electronic medical record.  These signatures attest to the fact that that the information above on your After Visit Summary has been reviewed and is understood.  Full responsibility of the confidentiality of this discharge information lies with you and/or your care-partner.

## 2017-06-11 ENCOUNTER — Telehealth: Payer: Self-pay | Admitting: *Deleted

## 2017-06-11 NOTE — Telephone Encounter (Signed)
  Follow up Call-  Call back number 06/10/2017  Post procedure Call Back phone  # [phone]  Permission to leave phone message Yes  Some recent data might be hidden     Patient questions:  Do you have a fever, pain , or abdominal swelling? No. Pain Score  0 *  Have you tolerated food without any problems? Yes.    Have you been able to return to your normal activities? Yes.    Do you have any questions about your discharge instructions: Diet   No. Medications  No. Follow up visit  No.  Do you have questions or concerns about your Care? No.  Actions: * If pain score is 4 or above: No action needed, pain <4.

## 2017-06-18 ENCOUNTER — Encounter: Payer: Self-pay | Admitting: Gastroenterology

## 2017-09-30 DIAGNOSIS — G43019 Migraine without aura, intractable, without status migrainosus: Secondary | ICD-10-CM | POA: Diagnosis not present

## 2017-09-30 DIAGNOSIS — G43111 Migraine with aura, intractable, with status migrainosus: Secondary | ICD-10-CM | POA: Diagnosis not present

## 2017-11-24 DIAGNOSIS — D1801 Hemangioma of skin and subcutaneous tissue: Secondary | ICD-10-CM | POA: Diagnosis not present

## 2017-11-24 DIAGNOSIS — D225 Melanocytic nevi of trunk: Secondary | ICD-10-CM | POA: Diagnosis not present

## 2017-11-24 DIAGNOSIS — D2261 Melanocytic nevi of right upper limb, including shoulder: Secondary | ICD-10-CM | POA: Diagnosis not present

## 2017-11-24 DIAGNOSIS — D2339 Other benign neoplasm of skin of other parts of face: Secondary | ICD-10-CM | POA: Diagnosis not present

## 2017-11-24 DIAGNOSIS — D2262 Melanocytic nevi of left upper limb, including shoulder: Secondary | ICD-10-CM | POA: Diagnosis not present

## 2017-12-04 DIAGNOSIS — Z4802 Encounter for removal of sutures: Secondary | ICD-10-CM | POA: Diagnosis not present

## 2018-01-12 ENCOUNTER — Telehealth: Payer: Self-pay | Admitting: Gastroenterology

## 2018-01-12 NOTE — Telephone Encounter (Signed)
The pt has been scheduled a follow up appt for bright red rectal bleeding.  Small amount on the stool.  She forgot about her canasa that was prescribed at last office visit.  She will try that until appt and call back if her bleeding worsens or she develops further symptoms.

## 2018-01-13 ENCOUNTER — Telehealth: Payer: Self-pay | Admitting: Gastroenterology

## 2018-01-13 NOTE — Telephone Encounter (Signed)
Patient states Canasa Supp. Are too expensive. She would like an alternative,please advise.

## 2018-01-14 NOTE — Telephone Encounter (Signed)
canasa is definitely the preferred medicine.  Can you check in to drug company assistance programs.  If that doesn't help, then will prescribe steroid suppository instead.  thanks

## 2018-01-14 NOTE — Telephone Encounter (Signed)
Spoke to patient to let her know that I would look into an assistance program to possibly lower the cost of her Canasa suppositories. Patient stated that she was not interested in the assistance program,she will pay the cost and pick up her prescription today.

## 2018-02-05 ENCOUNTER — Ambulatory Visit: Payer: BLUE CROSS/BLUE SHIELD | Admitting: Gastroenterology

## 2018-03-23 DIAGNOSIS — R82998 Other abnormal findings in urine: Secondary | ICD-10-CM | POA: Diagnosis not present

## 2018-03-23 DIAGNOSIS — Z0000 Encounter for general adult medical examination without abnormal findings: Secondary | ICD-10-CM | POA: Diagnosis not present

## 2018-03-23 DIAGNOSIS — R0789 Other chest pain: Secondary | ICD-10-CM | POA: Diagnosis not present

## 2018-04-07 DIAGNOSIS — Z0000 Encounter for general adult medical examination without abnormal findings: Secondary | ICD-10-CM | POA: Diagnosis not present

## 2018-04-07 DIAGNOSIS — Z1339 Encounter for screening examination for other mental health and behavioral disorders: Secondary | ICD-10-CM | POA: Diagnosis not present

## 2018-04-07 DIAGNOSIS — Z1331 Encounter for screening for depression: Secondary | ICD-10-CM | POA: Diagnosis not present

## 2018-04-07 DIAGNOSIS — Z681 Body mass index (BMI) 19 or less, adult: Secondary | ICD-10-CM | POA: Diagnosis not present

## 2018-06-06 ENCOUNTER — Other Ambulatory Visit: Payer: Self-pay | Admitting: Physician Assistant

## 2018-06-24 DIAGNOSIS — Z01419 Encounter for gynecological examination (general) (routine) without abnormal findings: Secondary | ICD-10-CM | POA: Diagnosis not present

## 2018-06-24 DIAGNOSIS — Z6820 Body mass index (BMI) 20.0-20.9, adult: Secondary | ICD-10-CM | POA: Diagnosis not present

## 2018-07-05 DIAGNOSIS — G43019 Migraine without aura, intractable, without status migrainosus: Secondary | ICD-10-CM | POA: Diagnosis not present

## 2018-07-05 DIAGNOSIS — G43111 Migraine with aura, intractable, with status migrainosus: Secondary | ICD-10-CM | POA: Diagnosis not present

## 2018-09-24 DIAGNOSIS — R42 Dizziness and giddiness: Secondary | ICD-10-CM | POA: Diagnosis not present

## 2018-09-24 DIAGNOSIS — H6593 Unspecified nonsuppurative otitis media, bilateral: Secondary | ICD-10-CM | POA: Diagnosis not present

## 2018-09-24 DIAGNOSIS — R0602 Shortness of breath: Secondary | ICD-10-CM | POA: Diagnosis not present

## 2018-09-24 DIAGNOSIS — Z20818 Contact with and (suspected) exposure to other bacterial communicable diseases: Secondary | ICD-10-CM | POA: Diagnosis not present

## 2018-09-24 DIAGNOSIS — J029 Acute pharyngitis, unspecified: Secondary | ICD-10-CM | POA: Diagnosis not present

## 2018-12-03 DIAGNOSIS — Z23 Encounter for immunization: Secondary | ICD-10-CM | POA: Diagnosis not present

## 2018-12-08 DIAGNOSIS — D224 Melanocytic nevi of scalp and neck: Secondary | ICD-10-CM | POA: Diagnosis not present

## 2018-12-08 DIAGNOSIS — D225 Melanocytic nevi of trunk: Secondary | ICD-10-CM | POA: Diagnosis not present

## 2018-12-08 DIAGNOSIS — D1801 Hemangioma of skin and subcutaneous tissue: Secondary | ICD-10-CM | POA: Diagnosis not present

## 2018-12-08 DIAGNOSIS — L821 Other seborrheic keratosis: Secondary | ICD-10-CM | POA: Diagnosis not present

## 2019-02-07 DIAGNOSIS — G43019 Migraine without aura, intractable, without status migrainosus: Secondary | ICD-10-CM | POA: Diagnosis not present

## 2019-02-07 DIAGNOSIS — G43111 Migraine with aura, intractable, with status migrainosus: Secondary | ICD-10-CM | POA: Diagnosis not present

## 2019-04-06 DIAGNOSIS — Z0000 Encounter for general adult medical examination without abnormal findings: Secondary | ICD-10-CM | POA: Diagnosis not present

## 2019-04-06 DIAGNOSIS — Z79899 Other long term (current) drug therapy: Secondary | ICD-10-CM | POA: Diagnosis not present

## 2019-04-06 DIAGNOSIS — Z20828 Contact with and (suspected) exposure to other viral communicable diseases: Secondary | ICD-10-CM | POA: Diagnosis not present

## 2019-04-13 DIAGNOSIS — Z1331 Encounter for screening for depression: Secondary | ICD-10-CM | POA: Diagnosis not present

## 2019-04-13 DIAGNOSIS — Z0000 Encounter for general adult medical examination without abnormal findings: Secondary | ICD-10-CM | POA: Diagnosis not present

## 2019-04-13 DIAGNOSIS — M79673 Pain in unspecified foot: Secondary | ICD-10-CM | POA: Diagnosis not present

## 2019-04-13 DIAGNOSIS — G43909 Migraine, unspecified, not intractable, without status migrainosus: Secondary | ICD-10-CM | POA: Diagnosis not present

## 2019-05-16 ENCOUNTER — Ambulatory Visit: Payer: BC Managed Care – PPO | Attending: Internal Medicine

## 2019-05-16 DIAGNOSIS — Z20822 Contact with and (suspected) exposure to covid-19: Secondary | ICD-10-CM | POA: Diagnosis not present

## 2019-05-17 LAB — SARS-COV-2, NAA 2 DAY TAT

## 2019-05-17 LAB — NOVEL CORONAVIRUS, NAA: SARS-CoV-2, NAA: NOT DETECTED

## 2019-05-19 ENCOUNTER — Other Ambulatory Visit: Payer: Self-pay

## 2019-05-19 ENCOUNTER — Encounter: Payer: Self-pay | Admitting: Podiatry

## 2019-05-19 ENCOUNTER — Ambulatory Visit (INDEPENDENT_AMBULATORY_CARE_PROVIDER_SITE_OTHER): Payer: BC Managed Care – PPO

## 2019-05-19 ENCOUNTER — Ambulatory Visit (INDEPENDENT_AMBULATORY_CARE_PROVIDER_SITE_OTHER): Payer: BC Managed Care – PPO | Admitting: Podiatry

## 2019-05-19 VITALS — BP 129/88 | HR 74 | Temp 97.3°F

## 2019-05-19 DIAGNOSIS — M778 Other enthesopathies, not elsewhere classified: Secondary | ICD-10-CM

## 2019-05-19 NOTE — Progress Notes (Signed)
Subjective:  Patient ID: Stacy Reeves, female    DOB: September 21, 1980,  MRN: [id number] HPI Chief Complaint  Patient presents with  . Foot Pain    1st MPJ right - aching, shooting pains x few months, no injury, tried motrin  . New Patient (Initial Visit)    39 y.o. female presents with the above complaint.   ROS: Denies fever chills nausea vomiting muscle aches pains calf pain back pain chest pain shortness of breath.  Avid runner runs about 3 miles a day 3-4 times a week states that the majority of her pain is on the medial aspect of the first metatarsal phalangeal joint denies any problem with extension or flexion.  Past Medical History:  Diagnosis Date  . Allergic rhinitis   . Allergy   . Atypical chest pain   . History of blood transfusion    at 78 months of age for transient anemia of infancy  . Reactive airway disease   . Ulcerative colitis [facility])    Past Surgical History:  Procedure Laterality Date  . CESAREAN SECTION N/A 12/24/2012   Procedure: Primary Cesarean Section Twins Baby "A" Boy @ 4163, Apgars 9/9, Baby "B" Boy @ 0221, Apgars 9/9;  Surgeon: Marylynn Pearson, MD;  Location: [facility];  Service: Obstetrics;  Laterality: N/A;  . mole removals    . WISDOM TOOTH EXTRACTION      Current Outpatient Medications:  .  Homeopathic Products (ALLERGY MEDICINE PO), Take by mouth., Disp: , Rfl:  .  influenza vac split quadrivalent PF (FLUZONE QUADRIVALENT) 0.5 ML injection, Fluzone Quad 2019-2020 (PF) 60 mcg (15 mcg x 4)/0.5 mL IM syringe  TO BE ADMINISTERED BY PHARMACIST FOR IMMUNIZATION, Disp: , Rfl:  .  Multiple Vitamin (MULTIVITAMIN) tablet, Take 1 tablet by mouth daily., Disp: , Rfl:   Current Facility-Administered Medications:  .  0.9 %  sodium chloride infusion, 500 mL, Intravenous, Once, Milus Banister, MD  Allergies  Allergen Reactions  . Sulfa Antibiotics Rash   Review of Systems Objective:   Vitals:   05/19/19 0901  BP: 129/88  Pulse: 74  Temp: (!) 97.3  F (36.3 C)    General: Well developed, nourished, in no acute distress, alert and oriented x3   Dermatological: Skin is warm, dry and supple bilateral. Nails x 10 are well maintained; remaining integument appears unremarkable at this time. There are no open sores, no preulcerative lesions, no rash or signs of infection present.  Vascular: Dorsalis Pedis artery and Posterior Tibial artery pedal pulses are 2/4 bilateral with immedate capillary fill time. Pedal hair growth present. No varicosities and no lower extremity edema present bilateral.   Neruologic: Grossly intact via light touch bilateral. Vibratory intact via tuning fork bilateral. Protective threshold with Semmes Wienstein monofilament intact to all pedal sites bilateral. Patellar and Achilles deep tendon reflexes 2+ bilateral. No Babinski or clonus noted bilateral.   Musculoskeletal: No gross boney pedal deformities bilateral. No pain, crepitus, or limitation noted with foot and ankle range of motion bilateral. Muscular strength 5/5 in all groups tested bilateral.  She has full range of motion of the first metatarsophalangeal joint dorsally and plantarly.  She has no pain on palpation of the sesamoids.  She does have pain on direct palpation of the medial aspect of the head of the first metatarsal with some bogginess there I feel that this is more than likely bursitis.  It is no erythema cellulitis drainage or odor no signs of osteoarthritic changes or seropositive arthropathies.  Very small callus in the area indicative of rubbing with shoe gear.  Follow-up with her as needed for orthotics.  Gait: Unassisted, Nonantalgic.    Radiographs:  Radiographs taken today demonstrate a rectus foot soft tissue swelling around the first metatarsophalangeal joint otherwise no acute findings in this osseously mature adult.  Assessment & Plan:   Assessment: Capsulitis bursitis first metatarsophalangeal joint medial aspect right foot.  Plan:  Discussed etiology pathology conservative versus surgical therapies at this point we discussed shoe gear in great detail today I did place a small amount of dexamethasone and local anesthetic at the point of medial first metatarsophalangeal joint to help alleviate the symptoms.     Nyazia Canevari T. [place], [place]

## 2019-05-30 ENCOUNTER — Ambulatory Visit: Payer: BC Managed Care – PPO | Attending: Internal Medicine

## 2019-05-30 DIAGNOSIS — Z20822 Contact with and (suspected) exposure to covid-19: Secondary | ICD-10-CM

## 2019-06-01 DIAGNOSIS — Z23 Encounter for immunization: Secondary | ICD-10-CM | POA: Diagnosis not present

## 2019-06-01 LAB — NOVEL CORONAVIRUS, NAA: SARS-CoV-2, NAA: NOT DETECTED

## 2019-06-01 LAB — SARS-COV-2, NAA 2 DAY TAT

## 2019-06-17 DIAGNOSIS — Z1152 Encounter for screening for COVID-19: Secondary | ICD-10-CM | POA: Diagnosis not present

## 2019-06-17 DIAGNOSIS — R0602 Shortness of breath: Secondary | ICD-10-CM | POA: Diagnosis not present

## 2019-06-17 DIAGNOSIS — J45909 Unspecified asthma, uncomplicated: Secondary | ICD-10-CM | POA: Diagnosis not present

## 2019-06-17 DIAGNOSIS — J309 Allergic rhinitis, unspecified: Secondary | ICD-10-CM | POA: Diagnosis not present

## 2019-06-28 DIAGNOSIS — Z6821 Body mass index (BMI) 21.0-21.9, adult: Secondary | ICD-10-CM | POA: Diagnosis not present

## 2019-06-28 DIAGNOSIS — Z01419 Encounter for gynecological examination (general) (routine) without abnormal findings: Secondary | ICD-10-CM | POA: Diagnosis not present

## 2019-07-07 DIAGNOSIS — R590 Localized enlarged lymph nodes: Secondary | ICD-10-CM | POA: Diagnosis not present

## 2019-08-01 ENCOUNTER — Ambulatory Visit: Payer: BC Managed Care – PPO | Attending: Internal Medicine

## 2019-08-01 DIAGNOSIS — Z20822 Contact with and (suspected) exposure to covid-19: Secondary | ICD-10-CM

## 2019-08-02 LAB — SARS-COV-2, NAA 2 DAY TAT

## 2019-08-02 LAB — NOVEL CORONAVIRUS, NAA: SARS-CoV-2, NAA: NOT DETECTED

## 2019-09-26 DIAGNOSIS — R21 Rash and other nonspecific skin eruption: Secondary | ICD-10-CM | POA: Diagnosis not present

## 2019-12-08 DIAGNOSIS — D2262 Melanocytic nevi of left upper limb, including shoulder: Secondary | ICD-10-CM | POA: Diagnosis not present

## 2019-12-08 DIAGNOSIS — D2261 Melanocytic nevi of right upper limb, including shoulder: Secondary | ICD-10-CM | POA: Diagnosis not present

## 2019-12-08 DIAGNOSIS — D225 Melanocytic nevi of trunk: Secondary | ICD-10-CM | POA: Diagnosis not present

## 2019-12-08 DIAGNOSIS — D2272 Melanocytic nevi of left lower limb, including hip: Secondary | ICD-10-CM | POA: Diagnosis not present

## 2020-03-15 DIAGNOSIS — Z1152 Encounter for screening for COVID-19: Secondary | ICD-10-CM | POA: Diagnosis not present

## 2020-03-19 DIAGNOSIS — Z1159 Encounter for screening for other viral diseases: Secondary | ICD-10-CM | POA: Diagnosis not present

## 2020-03-19 DIAGNOSIS — Z1152 Encounter for screening for COVID-19: Secondary | ICD-10-CM | POA: Diagnosis not present

## 2020-04-13 DIAGNOSIS — Z1152 Encounter for screening for COVID-19: Secondary | ICD-10-CM | POA: Diagnosis not present

## 2020-04-13 DIAGNOSIS — Z1159 Encounter for screening for other viral diseases: Secondary | ICD-10-CM | POA: Diagnosis not present

## 2020-04-16 DIAGNOSIS — Z0000 Encounter for general adult medical examination without abnormal findings: Secondary | ICD-10-CM | POA: Diagnosis not present

## 2020-04-16 DIAGNOSIS — R7989 Other specified abnormal findings of blood chemistry: Secondary | ICD-10-CM | POA: Diagnosis not present

## 2020-04-24 DIAGNOSIS — Z23 Encounter for immunization: Secondary | ICD-10-CM | POA: Diagnosis not present

## 2020-04-24 DIAGNOSIS — Z0000 Encounter for general adult medical examination without abnormal findings: Secondary | ICD-10-CM | POA: Diagnosis not present

## 2020-05-04 DIAGNOSIS — Z20822 Contact with and (suspected) exposure to covid-19: Secondary | ICD-10-CM | POA: Diagnosis not present

## 2020-05-04 DIAGNOSIS — Z03818 Encounter for observation for suspected exposure to other biological agents ruled out: Secondary | ICD-10-CM | POA: Diagnosis not present

## 2020-07-19 DIAGNOSIS — Z6821 Body mass index (BMI) 21.0-21.9, adult: Secondary | ICD-10-CM | POA: Diagnosis not present

## 2020-07-19 DIAGNOSIS — Z01419 Encounter for gynecological examination (general) (routine) without abnormal findings: Secondary | ICD-10-CM | POA: Diagnosis not present

## 2020-12-12 DIAGNOSIS — L700 Acne vulgaris: Secondary | ICD-10-CM | POA: Diagnosis not present

## 2020-12-12 DIAGNOSIS — D2262 Melanocytic nevi of left upper limb, including shoulder: Secondary | ICD-10-CM | POA: Diagnosis not present

## 2020-12-12 DIAGNOSIS — D225 Melanocytic nevi of trunk: Secondary | ICD-10-CM | POA: Diagnosis not present

## 2020-12-12 DIAGNOSIS — D2261 Melanocytic nevi of right upper limb, including shoulder: Secondary | ICD-10-CM | POA: Diagnosis not present

## 2021-03-13 DIAGNOSIS — R0981 Nasal congestion: Secondary | ICD-10-CM | POA: Diagnosis not present

## 2021-03-13 DIAGNOSIS — J029 Acute pharyngitis, unspecified: Secondary | ICD-10-CM | POA: Diagnosis not present

## 2021-03-13 DIAGNOSIS — J45909 Unspecified asthma, uncomplicated: Secondary | ICD-10-CM | POA: Diagnosis not present

## 2021-03-13 DIAGNOSIS — R051 Acute cough: Secondary | ICD-10-CM | POA: Diagnosis not present

## 2021-04-29 DIAGNOSIS — Z0000 Encounter for general adult medical examination without abnormal findings: Secondary | ICD-10-CM | POA: Diagnosis not present

## 2021-05-06 DIAGNOSIS — Z1339 Encounter for screening examination for other mental health and behavioral disorders: Secondary | ICD-10-CM | POA: Diagnosis not present

## 2021-05-06 DIAGNOSIS — Z0000 Encounter for general adult medical examination without abnormal findings: Secondary | ICD-10-CM | POA: Diagnosis not present

## 2021-05-06 DIAGNOSIS — Z1331 Encounter for screening for depression: Secondary | ICD-10-CM | POA: Diagnosis not present

## 2021-05-06 DIAGNOSIS — R7989 Other specified abnormal findings of blood chemistry: Secondary | ICD-10-CM | POA: Diagnosis not present

## 2021-09-10 DIAGNOSIS — Z1151 Encounter for screening for human papillomavirus (HPV): Secondary | ICD-10-CM | POA: Diagnosis not present

## 2021-09-10 DIAGNOSIS — Z124 Encounter for screening for malignant neoplasm of cervix: Secondary | ICD-10-CM | POA: Diagnosis not present

## 2021-09-10 DIAGNOSIS — Z6821 Body mass index (BMI) 21.0-21.9, adult: Secondary | ICD-10-CM | POA: Diagnosis not present

## 2021-09-10 DIAGNOSIS — Z01419 Encounter for gynecological examination (general) (routine) without abnormal findings: Secondary | ICD-10-CM | POA: Diagnosis not present

## 2021-10-09 ENCOUNTER — Encounter: Payer: Self-pay | Admitting: Nurse Practitioner

## 2021-10-30 DIAGNOSIS — Z1231 Encounter for screening mammogram for malignant neoplasm of breast: Secondary | ICD-10-CM | POA: Diagnosis not present

## 2021-11-01 ENCOUNTER — Encounter: Payer: Self-pay | Admitting: *Deleted

## 2021-11-08 ENCOUNTER — Encounter: Payer: Self-pay | Admitting: Nurse Practitioner

## 2021-11-08 ENCOUNTER — Other Ambulatory Visit (INDEPENDENT_AMBULATORY_CARE_PROVIDER_SITE_OTHER): Payer: BC Managed Care – PPO

## 2021-11-08 ENCOUNTER — Ambulatory Visit (INDEPENDENT_AMBULATORY_CARE_PROVIDER_SITE_OTHER): Payer: BC Managed Care – PPO | Admitting: Nurse Practitioner

## 2021-11-08 VITALS — BP 118/76 | HR 79 | Ht 66.0 in | Wt 130.0 lb

## 2021-11-08 DIAGNOSIS — K5120 Ulcerative (chronic) proctitis without complications: Secondary | ICD-10-CM

## 2021-11-08 LAB — CBC WITH DIFFERENTIAL/PLATELET
Basophils Absolute: 0.1 10*3/uL (ref 0.0–0.1)
Basophils Relative: 1 % (ref 0.0–3.0)
Eosinophils Absolute: 0.1 10*3/uL (ref 0.0–0.7)
Eosinophils Relative: 2.2 % (ref 0.0–5.0)
HCT: 40.1 % (ref 36.0–46.0)
Hemoglobin: 13.8 g/dL (ref 12.0–15.0)
Lymphocytes Relative: 32.1 % (ref 12.0–46.0)
Lymphs Abs: 1.8 10*3/uL (ref 0.7–4.0)
MCHC: 34.4 g/dL (ref 30.0–36.0)
MCV: 91.2 fl (ref 78.0–100.0)
Monocytes Absolute: 0.4 10*3/uL (ref 0.1–1.0)
Monocytes Relative: 7.8 % (ref 3.0–12.0)
Neutro Abs: 3.3 10*3/uL (ref 1.4–7.7)
Neutrophils Relative %: 56.9 % (ref 43.0–77.0)
Platelets: 232 10*3/uL (ref 150.0–400.0)
RBC: 4.4 Mil/uL (ref 3.87–5.11)
RDW: 12.5 % (ref 11.5–15.5)
WBC: 5.8 10*3/uL (ref 4.0–10.5)

## 2021-11-08 LAB — COMPREHENSIVE METABOLIC PANEL
ALT: 18 U/L (ref 0–35)
AST: 18 U/L (ref 0–37)
Albumin: 4.4 g/dL (ref 3.5–5.2)
Alkaline Phosphatase: 42 U/L (ref 39–117)
BUN: 17 mg/dL (ref 6–23)
CO2: 26 mEq/L (ref 19–32)
Calcium: 9.2 mg/dL (ref 8.4–10.5)
Chloride: 104 mEq/L (ref 96–112)
Creatinine, Ser: 0.85 mg/dL (ref 0.40–1.20)
GFR: 85.73 mL/min (ref >60.00)
Glucose, Bld: 72 mg/dL (ref 70–99)
Potassium: 4.1 mEq/L (ref 3.5–5.1)
Sodium: 137 mEq/L (ref 135–145)
Total Bilirubin: 0.7 mg/dL (ref 0.2–1.2)
Total Protein: 7.1 g/dL (ref 6.0–8.3)

## 2021-11-08 LAB — HIGH SENSITIVITY CRP: CRP, High Sensitivity: 0.42 mg/L (ref 0.000–5.000)

## 2021-11-08 MED ORDER — MESALAMINE 1000 MG RE SUPP: 1000.0000 mg | Freq: Every evening | RECTAL | 1 refills | Status: DC | PRN

## 2021-11-08 NOTE — Progress Notes (Signed)
11/08/2021 Stacy Reeves [id number] 1980/09/18   CHIEF COMPLAINT: Proctitis flare  HISTORY OF PRESENT ILLNESS: Stacy Reeves is a 41 year old female with a past medical history of migraine headaches and proctitis. Past C section.  She is followed by Dr. Ardis Hughs.  She had a flare of proctitis with diarrhea, intermittently with red blood and mucus per the rectum which occurred once daily for 7 days then abated.  She had recurrence of similar symptoms at the end of July which lasted for 3 weeks but completely resolved after she use Canasa suppository nightly for 2 weeks.  She denied having any associated abdominal pain during these flares.  She reported eating more meat and heavier foods prior to both flares of proctitis which occurred while visiting her in-laws.  She has 4 children ages 35, 44 and 41 year old twins her summer was busy and a bit stressful at times.  Infrequent NSAID use.  Currently, she feels well.  She is having a normal formed brown bowel movement daily.  No further diarrhea, rectal bleeding or mucus per the rectum.  Her most recent colonoscopy was 06/10/2017 which showed mild erythema in the rectum, biopsies were negative.  Mother with history of IBS and colon polyps.  No known family history of IBD or colorectal cancer.   Colonoscopy 06/10/2017: - Mild erythema in the rectum without clear demarcation to normal mucosa. Biopsies taken. ? VERY mild inflammation. - Small internal hemorrhoids. - RECTAL MUCOSA WITH NO SPECIFIC HISTOPATHOLOGIC CHANGES. - NEGATIVE FOR ACUTE INFLAMMATION, FEATURES OF CHRONICITY, GRANULOMAS OR INCREASED INTRAEPITHELIAL LYMPHOCYTES.  Colonoscopy 06/28/2008: 1) 6-7cm of inflammation in distal rectum, biopsied  2) Otherwise normal examination  3) Normal terminal ileum RECTAL INFLAMMATION, BIOPSIES: CHRONIC MINIMALLY ACTIVE  COLITIS.   Sigmoidoscopy in 2005, in [place], inflammation seen.   Past Medical History:  Diagnosis Date   Allergic  rhinitis    Allergy    Atypical chest pain    History of blood transfusion    at 21 months of age for transient anemia of infancy   Internal hemorrhoids    Reactive airway disease    Ulcerative colitis [facility])    Past Surgical History:  Procedure Laterality Date   CESAREAN SECTION N/A 12/24/2012   Procedure: Primary Cesarean Section Twins Baby "A" Boy @ 6789, Apgars 9/9, Baby "B" Boy @ 0221, Apgars 9/9;  Surgeon: Marylynn Pearson, MD;  Location: [facility];  Service: Obstetrics;  Laterality: N/A;   mole removals     WISDOM TOOTH EXTRACTION     Social History: She is married.  She has 2 sons and 2 daughters.  She is a stay-at-home mother.  Non-smoker.  Infrequent alcohol use.  No drug use.  Family History:  family history includes Cancer in her maternal grandfather and maternal grandmother; Colon polyps in her mother; Hyperlipidemia in her father; Hypertension in her mother; Irritable bowel syndrome in her mother; Lung cancer in her paternal grandfather.   Outpatient Encounter Medications as of 11/08/2021  Medication Sig   Homeopathic Products (ALLERGY MEDICINE PO) Take by mouth.   influenza vac split quadrivalent PF (FLUZONE QUADRIVALENT) 0.5 ML injection Fluzone Quad 2019-2020 (PF) 60 mcg (15 mcg x 4)/0.5 mL IM syringe  TO BE ADMINISTERED BY PHARMACIST FOR IMMUNIZATION   Multiple Vitamin (MULTIVITAMIN) tablet Take 1 tablet by mouth daily.   Facility-Administered Encounter Medications as of 11/08/2021  Medication   0.9 %  sodium chloride infusion   REVIEW OF SYSTEMS:  Gen: Denies fever,  sweats or chills. No weight loss.  CV: Denies chest pain, palpitations or edema. Resp: Denies cough, shortness of breath of hemoptysis.  GI: See HPI.  No GERD symptoms.  GU : Denies urinary burning, blood in urine, increased urinary frequency or incontinence. MS: Denies joint pain, muscles aches or weakness. Derm: Denies rash, itchiness, skin lesions or unhealing ulcers. Psych: Denies depression, anxiety  or memory loss. Heme: Denies bruising, easy bleeding. Neuro:  Denies headaches, dizziness or paresthesias. Endo:  Denies any problems with DM, thyroid or adrenal function.  PHYSICAL EXAM: BP 118/76   Pulse 79   Ht 5' 6"  (1.676 m)   Wt 130 lb (59 kg)   BMI 20.98 kg/m   General: 41 year old female in no acute distress. Head: Normocephalic and atraumatic. Eyes:  Sclerae non-icteric, conjunctive pink. Ears: Normal auditory acuity. Mouth: Dentition intact. No ulcers or lesions.  Neck: Supple, no lymphadenopathy or thyromegaly.  Lungs: Clear bilaterally to auscultation without wheezes, crackles or rhonchi. Heart: Regular rate and rhythm. No murmur, rub or gallop appreciated.  Abdomen: Soft, nontender, non distended. No masses. No hepatosplenomegaly. Normoactive bowel sounds x 4 quadrants.  Rectal: Deferred. Musculoskeletal: Symmetrical with no gross deformities. Skin: Warm and dry. No rash or lesions on visible extremities. Extremities: No edema. Neurological: Alert oriented x 4, no focal deficits.  Psychological:  Alert and cooperative. Normal mood and affect.  ASSESSMENT AND PLAN:  69) 41 year old female with a history of proctitis initially diagnosed in 2005, query ulcerative proctitis.  She had a flare of proctitis symptoms including diarrhea, intermittent bloody stools and mucus per the rectum for 1 week and of May into the first week of June and mid July which lasted for 3 weeks and resolved after she took [place] suppository nightly for 2 weeks.  Eating heavier foods and more meat possibly triggered these flares.  No abdominal pain.  Her most recent colonoscopy 06/10/2017 showed mild erythema in the rectum and biopsies were negative for acute inflammation or evidence of active IBD. -CBC, CMP and CRP level -Check fecal calprotectin level if she has another proctitis flare -Patient to contact office if proctitis symptoms recur -Avoid NSAIDs -Dr. Silverio Decamp (covering for Dr. Ardis Hughs  today) to verify colonoscopy recall date   CC:  Velna Hatchet, MD

## 2021-11-08 NOTE — Patient Instructions (Addendum)
Please avoid all NSAID's. Some examples of NSAID's are as follows: Aspirin (Bufferin, Bayer, and Excedrin) Ibuprofen (Advil, Motrin, Nuprin) Ketoprofen (Actron, Orudis) Naproxen (Aleve) Daypro  Indocin  Lodine  Naprosyn  Relafen  Vimovo Voltaren ____________________________________________________  Contact our office if your proctitis symptoms recur. _____________________________________________________  Your provider has requested that you go to the basement level for lab work before leaving today. Press "B" on the elevator. The lab is located at the first door on the left as you exit the elevator. ______________________________________________________  We have sent the following medications to your pharmacy for you to pick up at your convenience: Mesalamine 1 suppository every night as needed  _______________________________________________________  If you are age 40 or older, your body mass index should be between 23-30. Your Body mass index is 20.98 kg/m. If this is out of the aforementioned range listed, please consider follow up with your Primary Care Provider.  If you are age 68 or younger, your body mass index should be between 19-25. Your Body mass index is 20.98 kg/m. If this is out of the aformentioned range listed, please consider follow up with your Primary Care Provider.   ________________________________________________________  The [facility] providers would like to encourage you to use [facility] to communicate with providers for non-urgent requests or questions.  Due to long hold times on the telephone, sending your provider a message by [facility] may be a faster and more efficient way to get a response.  Please allow 48 business hours for a response.  Please remember that this is for non-urgent requests.  _______________________________________________________  Due to recent changes in healthcare laws, you may see the results of your imaging and laboratory studies on  MyChart before your provider has had a chance to review them.  We understand that in some cases there may be results that are confusing or concerning to you. Not all laboratory results come back in the same time frame and the provider may be waiting for multiple results in order to interpret others.  Please give [place] 48 hours in order for your provider to thoroughly review all the results before contacting the office for clarification of your results.

## 2021-12-05 ENCOUNTER — Other Ambulatory Visit: Payer: Self-pay | Admitting: Internal Medicine

## 2021-12-05 NOTE — Telephone Encounter (Signed)
Dr. Silverio Decamp, pls review office visit 11/08/2021. Pls verify when patient is due for next colonoscopy. THX.

## 2021-12-05 NOTE — Telephone Encounter (Signed)
Pt questioning her recall date for colonoscopy: Pt stated that she recently had an office visit with Carl Best NP 11/08/2021  and was notified that she would be contacted with the recall date: Chart reviewed and noted from Office Visit -Dr. Silverio Decamp (covering for Dr. Ardis Hughs today) to verify colonoscopy recall date  Please advise

## 2021-12-17 NOTE — Telephone Encounter (Signed)
Stacy Reeves, see Dr. Woodward Ku message below. Pls contact patient and let her know Dr. Silverio Decamp reviewed her records and last office visit note and she verified patient is due for a colonoscopy at this time. Pls schedule patient for a colonoscopy with Dr. Silverio Decamp. Corrin Parker.

## 2021-12-17 NOTE — Telephone Encounter (Signed)
Reviewed chart, recent office visit, labs and last colonoscopy report.  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  Thank you

## 2021-12-18 ENCOUNTER — Telehealth: Payer: Self-pay | Admitting: Gastroenterology

## 2021-12-18 NOTE — Telephone Encounter (Signed)
Patient returned your call, requesting a call back on [phone]. Please call to advise.

## 2021-12-19 NOTE — Telephone Encounter (Unsigned)
Left message for pt to call back  °

## 2021-12-20 NOTE — Telephone Encounter (Unsigned)
Left message for pt to call back  °

## 2021-12-23 ENCOUNTER — Other Ambulatory Visit: Payer: Self-pay

## 2021-12-23 DIAGNOSIS — Z1211 Encounter for screening for malignant neoplasm of colon: Secondary | ICD-10-CM

## 2021-12-23 NOTE — Telephone Encounter (Signed)
Pt called in to schedule her Colonoscopy: Chart reviewed: Pt was scheduled for a Colonoscopy on 01/22/2022 at  10:00 with Dr. Silverio Decamp: Pt requested MiraLAX prep instructions: Prep  instructions was sent via my chart, pt made aware; Ambulatory referral to GI placed.  Pt verbalized understanding with all questions answered.

## 2022-01-13 ENCOUNTER — Encounter: Payer: Self-pay | Admitting: Gastroenterology

## 2022-01-22 ENCOUNTER — Encounter: Payer: BC Managed Care – PPO | Admitting: Gastroenterology

## 2022-02-19 ENCOUNTER — Telehealth: Payer: Self-pay | Admitting: *Deleted

## 2022-02-19 NOTE — Telephone Encounter (Signed)
Pt.scheduled for colonoscopy on 03/05/22,please review history and advise if can be done in [place]?

## 2022-02-19 NOTE — Telephone Encounter (Signed)
Noted  

## 2022-02-19 NOTE — Telephone Encounter (Signed)
Stacy Reeves,  This pt is cleared for anesthetic care at [facility].  Thanks,  Osvaldo Angst

## 2022-02-26 ENCOUNTER — Ambulatory Visit (AMBULATORY_SURGERY_CENTER): Payer: BC Managed Care – PPO | Admitting: *Deleted

## 2022-02-26 VITALS — Ht 66.0 in | Wt 130.0 lb

## 2022-02-26 DIAGNOSIS — K5120 Ulcerative (chronic) proctitis without complications: Secondary | ICD-10-CM

## 2022-02-26 MED ORDER — NA SULFATE-K SULFATE-MG SULF 17.5-3.13-1.6 GM/177ML PO SOLN: 1.0000 | Freq: Once | ORAL | 0 refills | Status: AC

## 2022-02-26 MED ORDER — ONDANSETRON HCL 4 MG PO TABS: ORAL_TABLET | ORAL | 0 refills | Status: AC

## 2022-02-26 NOTE — Progress Notes (Signed)
No egg or soy allergy known to patient  No issues known to pt with past sedation with any surgeries or procedures Patient denies ever being told they had issues or difficulty with intubation  No FH of Malignant Hyperthermia Pt is not on diet pills Pt is not on  home 02  Pt is not on blood thinners  Pt denies issues with constipation  No A fib or A flutter Have any cardiac testing pending--no Pt instructed to use Singlecare.com or GoodRx for a price reduction on prep    Pt.stated she had gotten sick from last prep,zofran sent in to take prior to each prep dose,received prep instructions via my chart.  Patient's chart reviewed by Osvaldo Angst CNRA prior to previsit and patient appropriate for the [facility].  Previsit completed and red dot placed by patient's name on their procedure day (on provider's schedule).

## 2022-03-04 ENCOUNTER — Encounter: Payer: Self-pay | Admitting: Gastroenterology

## 2022-03-05 ENCOUNTER — Ambulatory Visit (AMBULATORY_SURGERY_CENTER): Payer: BC Managed Care – PPO | Admitting: Gastroenterology

## 2022-03-05 ENCOUNTER — Encounter: Payer: Self-pay | Admitting: Gastroenterology

## 2022-03-05 VITALS — BP 107/58 | HR 64 | Temp 98.4°F | Resp 13 | Ht 66.0 in | Wt 130.0 lb

## 2022-03-05 DIAGNOSIS — Z1211 Encounter for screening for malignant neoplasm of colon: Secondary | ICD-10-CM

## 2022-03-05 DIAGNOSIS — K5190 Ulcerative colitis, unspecified, without complications: Secondary | ICD-10-CM

## 2022-03-05 MED ORDER — SODIUM CHLORIDE 0.9 % IV SOLN: 500.0000 mL | Freq: Once | INTRAVENOUS | Status: DC

## 2022-03-05 NOTE — Progress Notes (Unsigned)
Report to pacu rn. Vss. Care resumed by rn. 

## 2022-03-05 NOTE — Progress Notes (Unsigned)
[facility] History and Physical   Primary Care Physician:  Velna Hatchet, MD   Reason for Procedure:  Colorectal cancer screening  Plan:    Screening colonoscopy with possible interventions as needed     HPI: Stacy Reeves is a very pleasant 42 y.o. female here for surveillance colonoscopy, history of ulcerative proctitis   The risks and benefits as well as alternatives of endoscopic procedure(s) have been discussed and reviewed. All questions answered. The patient agrees to proceed.    Past Medical History:  Diagnosis Date   Allergic rhinitis    Allergy    Atypical chest pain    History of blood transfusion    at 9 months of age for transient anemia of infancy   Internal hemorrhoids    Reactive airway disease    Ulcerative colitis [facility])     Past Surgical History:  Procedure Laterality Date   CESAREAN SECTION N/A 12/24/2012   Procedure: Primary Cesarean Section Twins Baby "A" Boy @ 2725, Apgars 9/9, Baby "B" Boy @ 0221, Apgars 9/9;  Surgeon: Marylynn Pearson, MD;  Location: [facility];  Service: Obstetrics;  Laterality: N/A;   mole removals     WISDOM TOOTH EXTRACTION      Prior to Admission medications   Medication Sig Start Date End Date Taking? Authorizing Provider  Multiple Vitamin (MULTIVITAMIN) tablet Take 1 tablet by mouth daily.   Yes [provider]  ondansetron (ZOFRAN) 4 MG tablet TAKE 30-60 MINUTES PRIOR TO EACH PREP DOSE 02/26/22  Yes Jadalynn Burr V, MD  EPINEPHrine 0.3 mg/0.3 mL IJ SOAJ injection AS DIRECTED INJECTION ONCE for 2 Patient not taking: Reported on 02/26/2022    [provider]  influenza vac split quadrivalent PF (FLUZONE QUADRIVALENT) 0.5 ML injection     [provider]  mesalamine (CANASA) 1000 MG suppository PLACE 1 SUPPOSITORY (1,000 MG TOTAL) RECTALLY AT BEDTIME AS NEEDED. 12/05/21   Pyrtle, Lajuan Lines, MD    Current Outpatient Medications  Medication Sig Dispense Refill   Multiple Vitamin  (MULTIVITAMIN) tablet Take 1 tablet by mouth daily.     ondansetron (ZOFRAN) 4 MG tablet TAKE 30-60 MINUTES PRIOR TO EACH PREP DOSE 2 tablet 0   EPINEPHrine 0.3 mg/0.3 mL IJ SOAJ injection AS DIRECTED INJECTION ONCE for 2 (Patient not taking: Reported on 02/26/2022)     influenza vac split quadrivalent PF (FLUZONE QUADRIVALENT) 0.5 ML injection      mesalamine (CANASA) 1000 MG suppository PLACE 1 SUPPOSITORY (1,000 MG TOTAL) RECTALLY AT BEDTIME AS NEEDED. 90 suppository 1   Current Facility-Administered Medications  Medication Dose Route Frequency Provider Last Rate Last Admin   0.9 %  sodium chloride infusion  500 mL Intravenous Once Milus Banister, MD       0.9 %  sodium chloride infusion  500 mL Intravenous Once Mauri Pole, MD        Allergies as of 03/05/2022 - Review Complete 03/05/2022  Allergen Reaction Noted   Bee venom Rash, Shortness Of Breath, and Swelling 10/08/2019   Sulfa antibiotics Rash 12/17/2010    Family History  Problem Relation Age of Onset   Hypertension Mother    Colon polyps Mother    Irritable bowel syndrome Mother    Hyperlipidemia Father    Cancer Maternal Grandmother    Cancer Maternal Grandfather    Lung cancer Paternal Grandfather    Colon cancer Neg Hx    Liver cancer Neg Hx    Esophageal cancer Neg Hx  Rectal cancer Neg Hx    Stomach cancer Neg Hx    Crohn's disease Neg Hx    Ulcerative colitis Neg Hx     Social History   Socioeconomic History   Marital status: Married    Spouse name: Not on file   Number of children: 4   Years of education: Not on file   Highest education level: Not on file  Occupational History   Occupation: stay home mom   Occupation: home maker  Tobacco Use   Smoking status: Never   Smokeless tobacco: Never  Vaping Use   Vaping Use: Never used  Substance and Sexual Activity   Alcohol use: Yes    Comment: a glass of wine occassionally   Drug use: No   Sexual activity: Yes  Other Topics Concern    Not on file  Social History Narrative   2 small children at home no ets. 22 month old and 41 yo    Married   Non smoker   Social Determinants of Radio broadcast assistant Strain: Not on file  Food Insecurity: Not on file  Transportation Needs: Not on file  Physical Activity: Not on file  Stress: Not on file  Social Connections: Not on file  Intimate Partner Violence: Not on file    Review of Systems:  All other review of systems negative except as mentioned in the HPI.  Physical Exam: Vital signs in last 24 hours: Blood Pressure (Abnormal) 136/94   Pulse 77   Temperature 98.4 F (36.9 C) (Skin)   Height 5\' 6"  (1.676 m)   Weight 130 lb (59 kg)   Last Menstrual Period 02/09/2022 (Approximate)   Oxygen Saturation 100%   Body Mass Index 20.98 kg/m  General:   Alert, NAD Lungs:  Clear .   Heart:  Regular rate and rhythm Abdomen:  Soft, nontender and nondistended. Neuro/Psych:  Alert and cooperative. Normal mood and affect. A and O x 3  Reviewed labs, radiology imaging, old records and pertinent past GI work up  Patient is appropriate for planned procedure(s) and anesthesia in an ambulatory setting   K. Denzil Magnuson , MD [phone]

## 2022-03-05 NOTE — Op Note (Signed)
[facility] Patient Name: Oletha Tolson Procedure Date: 03/05/2022 8:29 AM MRN: [id number] Endoscopist: Mauri Pole , MD, [id number] Age: 42 Referring MD:  Date of Birth: [birth year] Gender: Female Account #: [id number] Procedure:                Colonoscopy Indications:              High risk colon cancer surveillance: Ulcerative                            left sided colitis of 8 (or more) years duration Medicines:                Monitored Anesthesia Care Procedure:                Pre-Anesthesia Assessment:                           - Prior to the procedure, a History and Physical                            was performed, and patient medications and                            allergies were reviewed. The patient's tolerance of                            previous anesthesia was also reviewed. The risks                            and benefits of the procedure and the sedation                            options and risks were discussed with the patient.                            All questions were answered, and informed consent                            was obtained. Prior Anticoagulants: The patient has                            taken no anticoagulant or antiplatelet agents. ASA                            Grade Assessment: II - A patient with mild systemic                            disease. After reviewing the risks and benefits,                            the patient was deemed in satisfactory condition to                            undergo the procedure.  After obtaining informed consent, the colonoscope                            was passed under direct vision. Throughout the                            procedure, the patient's blood pressure, pulse, and                            oxygen saturations were monitored continuously. The                            Olympus PCF-H190DL (#[id number]) Colonoscope was                             introduced through the anus and advanced to the the                            cecum, identified by appendiceal orifice and                            ileocecal valve. The colonoscopy was performed                            without difficulty. The patient tolerated the                            procedure well. The quality of the bowel                            preparation was adequate to identify polyps greater                            than 5 mm in size. The ileocecal valve, appendiceal                            orifice, and rectum were photographed. Scope In: 9:03:56 AM Scope Out: 9:23:47 AM Scope Withdrawal Time: 0 hours 10 minutes 40 seconds  Total Procedure Duration: 0 hours 19 minutes 51 seconds  Findings:                 The perianal and digital rectal examinations were                            normal.                           A moderate amount of stool was found in the entire                            colon, interfering with visualization. Lavage of                            the area was performed, resulting in clearance with  adequate visualization.                           Non-bleeding external and internal hemorrhoids were                            found during retroflexion. The hemorrhoids were                            medium-sized.                           The exam was otherwise without abnormality, mucosa                            appeared normal. Complications:            No immediate complications. Estimated Blood Loss:     Estimated blood loss was minimal. Impression:               - Stool in the entire examined colon.                           - Non-bleeding external and internal hemorrhoids.                           - The examination was otherwise normal.                           - No specimens collected. Recommendation:           - Patient has a contact number available for                            emergencies. The signs  and symptoms of potential                            delayed complications were discussed with the                            patient. Return to normal activities tomorrow.                            Written discharge instructions were provided to the                            patient.                           - Resume previous diet.                           - Continue present medications.                           - Repeat colonoscopy in 3 years for surveillance  due to h/o ulcerative proctiotis and because the                            bowel preparation was suboptimal.                           - For future colonoscopy the patient will require                            an extended preparation. If there are any                            questions, please contact the gastroenterologist.                           - Follow up in GI office for hemorrhoidal band                            ligation if symptomatic Napoleon Form, MD 03/05/2022 9:33:39 AM This report has been signed electronically.

## 2022-03-05 NOTE — Patient Instructions (Addendum)
Handout on hemorrhoids given to patient. Resume previous diet and continue present medications. Repeat colonoscopy for surveillance in 3 years due to h/o ulcerative proctiotis and due to bowel prep.  Recommended extended preparation for next colonoscopy. (2-day prep) Follow-up in GI office for hemorrhoidal band ligation if symptomatic.    YOU HAD AN ENDOSCOPIC PROCEDURE TODAY AT [facility]:   Refer to the procedure report that was given to you for any specific questions about what was found during the examination.  If the procedure report does not answer your questions, please call your gastroenterologist to clarify.  If you requested that your care partner not be given the details of your procedure findings, then the procedure report has been included in a sealed envelope for you to review at your convenience later.  YOU SHOULD EXPECT: Some feelings of bloating in the abdomen. Passage of more gas than usual.  Walking can help get rid of the air that was put into your GI tract during the procedure and reduce the bloating. If you had a lower endoscopy (such as a colonoscopy or flexible sigmoidoscopy) you may notice spotting of blood in your stool or on the toilet paper. If you underwent a bowel prep for your procedure, you may not have a normal bowel movement for a few days.  Please Note:  You might notice some irritation and congestion in your nose or some drainage.  This is from the oxygen used during your procedure.  There is no need for concern and it should clear up in a day or so.  SYMPTOMS TO REPORT IMMEDIATELY:  Following lower endoscopy (colonoscopy or flexible sigmoidoscopy):  Excessive amounts of blood in the stool  Significant tenderness or worsening of abdominal pains  Swelling of the abdomen that is new, acute  Fever of 100F or higher  For urgent or emergent issues, a gastroenterologist can be reached at any hour by calling [phone]. Do not use MyChart  messaging for urgent concerns.    DIET:  We do recommend a small meal at first, but then you may proceed to your regular diet.  Drink plenty of fluids but you should avoid alcoholic beverages for 24 hours.  ACTIVITY:  You should plan to take it easy for the rest of today and you should NOT DRIVE or use heavy machinery until tomorrow (because of the sedation medicines used during the test).    FOLLOW UP: Our staff will call the number listed on your records the next business day following your procedure.  We will call around 7:15- 8:00 am to check on you and address any questions or concerns that you may have regarding the information given to you following your procedure. If we do not reach you, we will leave a message.     If any biopsies were taken you will be contacted by phone or by letter within the next 1-3 weeks.  Please call us at [phone] if you have not heard about the biopsies in 3 weeks.    SIGNATURES/CONFIDENTIALITY: You and/or your care partner have signed paperwork which will be entered into your electronic medical record.  These signatures attest to the fact that that the information above on your After Visit Summary has been reviewed and is understood.  Full responsibility of the confidentiality of this discharge information lies with you and/or your care-partner.

## 2022-03-05 NOTE — Progress Notes (Unsigned)
Pt's states no medical or surgical changes since previsit or office visit. VS assessed by C.W 

## 2022-03-06 ENCOUNTER — Telehealth: Payer: Self-pay

## 2022-03-06 ENCOUNTER — Encounter: Payer: Self-pay | Admitting: Gastroenterology

## 2022-03-06 NOTE — Telephone Encounter (Signed)
No answer, left a message to call if having any issues or concerns, B.Dorian Renfro RN. °

## 2022-04-25 DIAGNOSIS — J101 Influenza due to other identified influenza virus with other respiratory manifestations: Secondary | ICD-10-CM | POA: Diagnosis not present

## 2022-04-25 DIAGNOSIS — R509 Fever, unspecified: Secondary | ICD-10-CM | POA: Diagnosis not present

## 2022-04-25 DIAGNOSIS — R051 Acute cough: Secondary | ICD-10-CM | POA: Diagnosis not present

## 2022-04-25 DIAGNOSIS — Z1152 Encounter for screening for COVID-19: Secondary | ICD-10-CM | POA: Diagnosis not present

## 2022-05-06 DIAGNOSIS — R002 Palpitations: Secondary | ICD-10-CM | POA: Diagnosis not present

## 2022-05-06 DIAGNOSIS — R7989 Other specified abnormal findings of blood chemistry: Secondary | ICD-10-CM | POA: Diagnosis not present

## 2022-05-12 DIAGNOSIS — Z0000 Encounter for general adult medical examination without abnormal findings: Secondary | ICD-10-CM | POA: Diagnosis not present

## 2022-05-12 DIAGNOSIS — Z1331 Encounter for screening for depression: Secondary | ICD-10-CM | POA: Diagnosis not present

## 2022-05-12 DIAGNOSIS — Z1339 Encounter for screening examination for other mental health and behavioral disorders: Secondary | ICD-10-CM | POA: Diagnosis not present

## 2022-05-15 DIAGNOSIS — J029 Acute pharyngitis, unspecified: Secondary | ICD-10-CM | POA: Diagnosis not present

## 2022-05-15 DIAGNOSIS — J069 Acute upper respiratory infection, unspecified: Secondary | ICD-10-CM | POA: Diagnosis not present

## 2022-06-12 DIAGNOSIS — D225 Melanocytic nevi of trunk: Secondary | ICD-10-CM | POA: Diagnosis not present

## 2022-06-12 DIAGNOSIS — D2261 Melanocytic nevi of right upper limb, including shoulder: Secondary | ICD-10-CM | POA: Diagnosis not present

## 2022-06-12 DIAGNOSIS — D485 Neoplasm of uncertain behavior of skin: Secondary | ICD-10-CM | POA: Diagnosis not present

## 2022-12-06 DIAGNOSIS — R051 Acute cough: Secondary | ICD-10-CM | POA: Diagnosis not present

## 2022-12-06 DIAGNOSIS — M791 Myalgia, unspecified site: Secondary | ICD-10-CM | POA: Diagnosis not present

## 2022-12-06 DIAGNOSIS — J069 Acute upper respiratory infection, unspecified: Secondary | ICD-10-CM | POA: Diagnosis not present

## 2022-12-06 DIAGNOSIS — R509 Fever, unspecified: Secondary | ICD-10-CM | POA: Diagnosis not present

## 2022-12-10 DIAGNOSIS — R058 Other specified cough: Secondary | ICD-10-CM | POA: Diagnosis not present

## 2022-12-10 DIAGNOSIS — J189 Pneumonia, unspecified organism: Secondary | ICD-10-CM | POA: Diagnosis not present

## 2022-12-29 DIAGNOSIS — J189 Pneumonia, unspecified organism: Secondary | ICD-10-CM | POA: Diagnosis not present

## 2023-01-08 DIAGNOSIS — Z1151 Encounter for screening for human papillomavirus (HPV): Secondary | ICD-10-CM | POA: Diagnosis not present

## 2023-01-08 DIAGNOSIS — Z6820 Body mass index (BMI) 20.0-20.9, adult: Secondary | ICD-10-CM | POA: Diagnosis not present

## 2023-01-08 DIAGNOSIS — Z01419 Encounter for gynecological examination (general) (routine) without abnormal findings: Secondary | ICD-10-CM | POA: Diagnosis not present

## 2023-01-08 DIAGNOSIS — Z1231 Encounter for screening mammogram for malignant neoplasm of breast: Secondary | ICD-10-CM | POA: Diagnosis not present

## 2023-01-08 DIAGNOSIS — Z124 Encounter for screening for malignant neoplasm of cervix: Secondary | ICD-10-CM | POA: Diagnosis not present

## 2023-01-12 ENCOUNTER — Other Ambulatory Visit: Payer: Self-pay | Admitting: Obstetrics and Gynecology

## 2023-01-12 DIAGNOSIS — R928 Other abnormal and inconclusive findings on diagnostic imaging of breast: Secondary | ICD-10-CM

## 2023-01-16 DIAGNOSIS — N870 Mild cervical dysplasia: Secondary | ICD-10-CM | POA: Diagnosis not present

## 2023-01-16 DIAGNOSIS — Z3202 Encounter for pregnancy test, result negative: Secondary | ICD-10-CM | POA: Diagnosis not present

## 2023-01-16 DIAGNOSIS — R87610 Atypical squamous cells of undetermined significance on cytologic smear of cervix (ASC-US): Secondary | ICD-10-CM | POA: Diagnosis not present

## 2023-01-28 ENCOUNTER — Ambulatory Visit
Admission: RE | Admit: 2023-01-28 | Discharge: 2023-01-28 | Disposition: A | Discharge status: Home / Self Care | Payer: BC Managed Care – PPO | Source: Ambulatory Visit | Attending: Obstetrics and Gynecology | Admitting: Obstetrics and Gynecology

## 2023-01-28 ENCOUNTER — Other Ambulatory Visit: Payer: Self-pay | Admitting: Obstetrics and Gynecology

## 2023-01-28 DIAGNOSIS — N6320 Unspecified lump in the left breast, unspecified quadrant: Secondary | ICD-10-CM

## 2023-01-28 DIAGNOSIS — N6325 Unspecified lump in the left breast, overlapping quadrants: Secondary | ICD-10-CM | POA: Diagnosis not present

## 2023-01-28 DIAGNOSIS — R92322 Mammographic fibroglandular density, left breast: Secondary | ICD-10-CM | POA: Diagnosis not present

## 2023-01-28 DIAGNOSIS — R928 Other abnormal and inconclusive findings on diagnostic imaging of breast: Secondary | ICD-10-CM

## 2023-01-29 ENCOUNTER — Inpatient Hospital Stay
Admission: RE | Admit: 2023-01-29 | Discharge: 2023-01-29 | Discharge status: Home / Self Care | Payer: BC Managed Care – PPO | Source: Ambulatory Visit | Attending: Obstetrics and Gynecology | Admitting: Obstetrics and Gynecology

## 2023-01-29 ENCOUNTER — Ambulatory Visit
Admission: RE | Admit: 2023-01-29 | Discharge: 2023-01-29 | Disposition: A | Discharge status: Home / Self Care | Payer: BC Managed Care – PPO | Source: Ambulatory Visit | Attending: Obstetrics and Gynecology | Admitting: Obstetrics and Gynecology

## 2023-01-29 DIAGNOSIS — N6320 Unspecified lump in the left breast, unspecified quadrant: Secondary | ICD-10-CM

## 2023-01-29 DIAGNOSIS — R928 Other abnormal and inconclusive findings on diagnostic imaging of breast: Secondary | ICD-10-CM

## 2023-01-29 DIAGNOSIS — N6022 Fibroadenosis of left breast: Secondary | ICD-10-CM | POA: Diagnosis not present

## 2023-01-29 DIAGNOSIS — N6325 Unspecified lump in the left breast, overlapping quadrants: Secondary | ICD-10-CM | POA: Diagnosis not present

## 2023-01-29 HISTORY — PX: BREAST BIOPSY: SHX20

## 2023-01-30 LAB — SURGICAL PATHOLOGY

## 2023-04-23 DIAGNOSIS — R300 Dysuria: Secondary | ICD-10-CM | POA: Diagnosis not present

## 2023-04-23 DIAGNOSIS — R319 Hematuria, unspecified: Secondary | ICD-10-CM | POA: Diagnosis not present

## 2023-05-11 DIAGNOSIS — Z0000 Encounter for general adult medical examination without abnormal findings: Secondary | ICD-10-CM | POA: Diagnosis not present

## 2023-05-15 DIAGNOSIS — Z1339 Encounter for screening examination for other mental health and behavioral disorders: Secondary | ICD-10-CM | POA: Diagnosis not present

## 2023-05-15 DIAGNOSIS — Z23 Encounter for immunization: Secondary | ICD-10-CM | POA: Diagnosis not present

## 2023-05-15 DIAGNOSIS — Z1331 Encounter for screening for depression: Secondary | ICD-10-CM | POA: Diagnosis not present

## 2023-05-15 DIAGNOSIS — Z0000 Encounter for general adult medical examination without abnormal findings: Secondary | ICD-10-CM | POA: Diagnosis not present

## 2023-07-01 ENCOUNTER — Other Ambulatory Visit: Payer: Self-pay | Admitting: Obstetrics and Gynecology

## 2023-07-01 DIAGNOSIS — R928 Other abnormal and inconclusive findings on diagnostic imaging of breast: Secondary | ICD-10-CM

## 2023-07-13 DIAGNOSIS — D225 Melanocytic nevi of trunk: Secondary | ICD-10-CM | POA: Diagnosis not present

## 2023-07-13 DIAGNOSIS — D2261 Melanocytic nevi of right upper limb, including shoulder: Secondary | ICD-10-CM | POA: Diagnosis not present

## 2023-07-13 DIAGNOSIS — D2262 Melanocytic nevi of left upper limb, including shoulder: Secondary | ICD-10-CM | POA: Diagnosis not present

## 2023-07-13 DIAGNOSIS — L920 Granuloma annulare: Secondary | ICD-10-CM | POA: Diagnosis not present

## 2023-07-22 ENCOUNTER — Emergency Department (HOSPITAL_BASED_OUTPATIENT_CLINIC_OR_DEPARTMENT_OTHER)
Admission: EM | Admit: 2023-07-22 | Discharge: 2023-07-22 | Disposition: A | Discharge status: Home / Self Care | Attending: Emergency Medicine | Admitting: Emergency Medicine

## 2023-07-22 ENCOUNTER — Encounter (HOSPITAL_BASED_OUTPATIENT_CLINIC_OR_DEPARTMENT_OTHER): Payer: Self-pay | Admitting: Emergency Medicine

## 2023-07-22 ENCOUNTER — Other Ambulatory Visit: Payer: Self-pay

## 2023-07-22 DIAGNOSIS — S61213A Laceration without foreign body of left middle finger without damage to nail, initial encounter: Secondary | ICD-10-CM | POA: Diagnosis not present

## 2023-07-22 DIAGNOSIS — Z23 Encounter for immunization: Secondary | ICD-10-CM | POA: Insufficient documentation

## 2023-07-22 DIAGNOSIS — W260XXA Contact with knife, initial encounter: Secondary | ICD-10-CM | POA: Insufficient documentation

## 2023-07-22 MED ORDER — TETANUS-DIPHTH-ACELL PERTUSSIS 5-2.5-18.5 LF-MCG/0.5 IM SUSY
0.5000 mL | PREFILLED_SYRINGE | Freq: Once | INTRAMUSCULAR | Status: AC
  Administered 2023-07-22: 0.5 mL via INTRAMUSCULAR
  Filled 2023-07-22: qty 0.5

## 2023-07-22 NOTE — Discharge Instructions (Addendum)
 Do NOT apply antibiotic ointment to glue. Keep finger dry. Splint as needed for protection.

## 2023-07-22 NOTE — ED Provider Notes (Signed)
 [place] EMERGENCY DEPARTMENT AT [facility] Provider Note   CSN: [id number] Arrival date & time: 07/22/23  4098     History  Chief Complaint  Patient presents with   Laceration    Stacy Reeves is a 43 y.o. female.  43 year old female presents with laceration to her left 3rd/middle finger. Patient was changing a razor blade when she accidentally cut herself. Last td 2013. Bleeding controlled. No weakness/paresthesia. Right hand dominant.        Home Medications Prior to Admission medications   Medication Sig Start Date End Date Taking? Authorizing Provider  EPINEPHrine  0.3 mg/0.3 mL IJ SOAJ injection AS DIRECTED INJECTION ONCE for 2 Patient not taking: Reported on 02/26/2022    [provider]  influenza vac split quadrivalent PF (FLUZONE QUADRIVALENT) 0.5 ML injection     [provider]  mesalamine  (CANASA ) 1000 MG suppository PLACE 1 SUPPOSITORY (1,000 MG TOTAL) RECTALLY AT BEDTIME AS NEEDED. 12/05/21   Pyrtle, Amber Bail, MD  Multiple Vitamin (MULTIVITAMIN) tablet Take 1 tablet by mouth daily.    [provider]  ondansetron  (ZOFRAN ) 4 MG tablet TAKE 30-60 MINUTES PRIOR TO EACH PREP DOSE 02/26/22   Nandigam, Kavitha V, MD      Allergies    Bee venom and Sulfa antibiotics    Review of Systems   Review of Systems Negative except as per HPI Physical Exam Updated Vital Signs BP (!) 163/89 (BP Location: Right Arm)   Pulse (!) 57   Temp 98.5 F (36.9 C) (Oral)   Resp 16   SpO2 100%  Physical Exam Vitals and nursing note reviewed.  Constitutional:      General: She is not in acute distress.    Appearance: She is well-developed. She is not diaphoretic.  HENT:     Head: Normocephalic and atraumatic.  Pulmonary:     Effort: Pulmonary effort is normal.  Musculoskeletal:        General: Signs of injury present.     Comments: Approx 1 cm laceration to the middle phalanx of the 3rd digit, ulnar aspect, does not cross joint line. No  active bleeding, no weakness, sensation intact, cap refill intact   Skin:    General: Skin is warm and dry.     Findings: No erythema or rash.  Neurological:     Mental Status: She is alert and oriented to person, place, and time.     Sensory: No sensory deficit.     Motor: No weakness.  Psychiatric:        Behavior: Behavior normal.     ED Results / Procedures / Treatments   Labs (all labs ordered are listed, but only abnormal results are displayed) Labs Reviewed - No data to display  EKG None  Radiology No results found.  Procedures .Laceration Repair  Date/Time: 07/22/2023 10:19 AM  Performed by: Darlis Eisenmenger, PA-C Authorized by: Darlis Eisenmenger, PA-C   Consent:    Consent obtained:  Verbal   Consent given by:  Patient   Risks, benefits, and alternatives were discussed: yes     Risks discussed:  Infection, pain, need for additional repair and tendon damage   Alternatives discussed:  No treatment (suture vs dermabond) Universal protocol:    Patient identity confirmed:  Verbally with patient Anesthesia:    Anesthesia method:  None Laceration details:    Location:  Finger   Finger location:  L long finger   Length (cm):  1   Depth (  mm):  2 Pre-procedure details:    Preparation:  Patient was prepped and draped in usual sterile fashion Exploration:    Hemostasis achieved with:  Tourniquet   Wound exploration: wound explored through full range of motion and entire depth of wound visualized     Wound extent: no foreign body, no signs of injury and no tendon damage     Contaminated: no   Treatment:    Area cleansed with:  Saline   Amount of cleaning:  Standard   Irrigation solution:  Sterile saline   Debridement:  None   Undermining:  None   Scar revision: no   Skin repair:    Repair method:  Tissue adhesive Approximation:    Approximation:  Close Repair type:    Repair type:  Simple Post-procedure details:    Dressing:  Splint for protection    Procedure completion:  Tolerated     Medications Ordered in ED Medications  Tdap (BOOSTRIX) injection 0.5 mL (has no administration in time range)    ED Course/ Medical Decision Making/ A&P                                 Medical Decision Making Risk Prescription drug management.   43 year old female with laceration to left third finger as above.  Tourniquet applied, wound irrigated, does not extend to tendons, no weakness, no loss of sensation, cap refill intact.  Closed with Dermabond after shared decision making.  Splint for protection.  Recheck as needed.  Tetanus updated.        Final Clinical Impression(s) / ED Diagnoses Final diagnoses:  Laceration of left middle finger without foreign body without damage to nail, initial encounter    Rx / DC Orders ED Discharge Orders     None         Darlis Eisenmenger, PA-C 07/22/23 1020    Rosealee Concha, MD 07/22/23 1040

## 2023-07-22 NOTE — ED Triage Notes (Signed)
 Lac to left middle finger from changing razor blade. Unknown Tetanus status.

## 2023-07-28 ENCOUNTER — Other Ambulatory Visit: Payer: Self-pay | Admitting: Obstetrics and Gynecology

## 2023-07-28 DIAGNOSIS — N63 Unspecified lump in unspecified breast: Secondary | ICD-10-CM

## 2023-08-04 ENCOUNTER — Ambulatory Visit
Admission: RE | Admit: 2023-08-04 | Discharge: 2023-08-04 | Disposition: A | Discharge status: Home / Self Care | Source: Ambulatory Visit | Attending: Obstetrics and Gynecology | Admitting: Obstetrics and Gynecology

## 2023-08-04 ENCOUNTER — Ambulatory Visit: Admission: RE | Admit: 2023-08-04 | Source: Ambulatory Visit

## 2023-08-04 DIAGNOSIS — N6022 Fibroadenosis of left breast: Secondary | ICD-10-CM | POA: Diagnosis not present

## 2023-08-04 DIAGNOSIS — N63 Unspecified lump in unspecified breast: Secondary | ICD-10-CM

## 2023-11-02 DIAGNOSIS — R300 Dysuria: Secondary | ICD-10-CM | POA: Diagnosis not present

## 2023-12-17 DIAGNOSIS — Z23 Encounter for immunization: Secondary | ICD-10-CM | POA: Diagnosis not present

## 2023-12-28 ENCOUNTER — Other Ambulatory Visit: Payer: Self-pay | Admitting: Obstetrics and Gynecology

## 2023-12-28 DIAGNOSIS — R928 Other abnormal and inconclusive findings on diagnostic imaging of breast: Secondary | ICD-10-CM

## 2024-01-11 ENCOUNTER — Other Ambulatory Visit: Payer: Self-pay | Admitting: Obstetrics and Gynecology

## 2024-01-12 ENCOUNTER — Ambulatory Visit
Admission: RE | Admit: 2024-01-12 | Discharge: 2024-01-12 | Disposition: A | Discharge status: Home / Self Care | Source: Ambulatory Visit | Attending: Obstetrics and Gynecology | Admitting: Obstetrics and Gynecology

## 2024-01-12 DIAGNOSIS — R921 Mammographic calcification found on diagnostic imaging of breast: Secondary | ICD-10-CM | POA: Diagnosis not present

## 2024-01-12 DIAGNOSIS — R928 Other abnormal and inconclusive findings on diagnostic imaging of breast: Secondary | ICD-10-CM

## 2024-02-04 DIAGNOSIS — R87610 Atypical squamous cells of undetermined significance on cytologic smear of cervix (ASC-US): Secondary | ICD-10-CM | POA: Diagnosis not present
